# Patient Record
Sex: Female | Born: 1935 | Race: White | Hispanic: No | Smoking: Never smoker
Health system: Southern US, Community
[De-identification: ages and names within clinical notes are randomized; demographics above are authoritative.]

## PROBLEM LIST (undated history)

## (undated) DIAGNOSIS — E119 Type 2 diabetes mellitus without complications: Secondary | ICD-10-CM

## (undated) DIAGNOSIS — D499 Neoplasm of unspecified behavior of unspecified site: Secondary | ICD-10-CM

## (undated) DIAGNOSIS — M810 Age-related osteoporosis without current pathological fracture: Secondary | ICD-10-CM

## (undated) DIAGNOSIS — Z780 Asymptomatic menopausal state: Secondary | ICD-10-CM

## (undated) DIAGNOSIS — M1990 Unspecified osteoarthritis, unspecified site: Secondary | ICD-10-CM

## (undated) DIAGNOSIS — E559 Vitamin D deficiency, unspecified: Secondary | ICD-10-CM

## (undated) DIAGNOSIS — I10 Essential (primary) hypertension: Secondary | ICD-10-CM

## (undated) HISTORY — DX: Vitamin D deficiency, unspecified: E55.9

## (undated) HISTORY — DX: Type 2 diabetes mellitus without complications: E11.9

## (undated) HISTORY — PX: CHOLECYSTECTOMY: SHX55

## (undated) HISTORY — DX: Unspecified osteoarthritis, unspecified site: M19.90

## (undated) HISTORY — DX: Asymptomatic menopausal state: Z78.0

## (undated) HISTORY — PX: CATARACT EXTRACTION: SUR2

## (undated) HISTORY — DX: Essential (primary) hypertension: I10

## (undated) HISTORY — DX: Neoplasm of unspecified behavior of unspecified site: D49.9

## (undated) HISTORY — DX: Age-related osteoporosis without current pathological fracture: M81.0

---

## 2018-08-11 ENCOUNTER — Encounter: Payer: Self-pay | Admitting: Cardiology

## 2018-08-11 DIAGNOSIS — R1310 Dysphagia, unspecified: Secondary | ICD-10-CM | POA: Insufficient documentation

## 2018-08-11 DIAGNOSIS — R011 Cardiac murmur, unspecified: Secondary | ICD-10-CM | POA: Insufficient documentation

## 2018-08-13 DIAGNOSIS — K225 Diverticulum of esophagus, acquired: Secondary | ICD-10-CM | POA: Insufficient documentation

## 2018-08-14 DIAGNOSIS — Z09 Encounter for follow-up examination after completed treatment for conditions other than malignant neoplasm: Secondary | ICD-10-CM | POA: Insufficient documentation

## 2018-08-19 ENCOUNTER — Encounter: Payer: Self-pay | Admitting: Cardiology

## 2018-08-19 ENCOUNTER — Ambulatory Visit (INDEPENDENT_AMBULATORY_CARE_PROVIDER_SITE_OTHER): Payer: Medicare Other | Admitting: Cardiology

## 2018-08-19 DIAGNOSIS — R1310 Dysphagia, unspecified: Secondary | ICD-10-CM

## 2018-08-19 DIAGNOSIS — R011 Cardiac murmur, unspecified: Secondary | ICD-10-CM

## 2018-08-19 NOTE — Progress Notes (Signed)
Cardiology Office Note:    Date:  08/19/2018   ID:  Cynthia Hart, DOB October 07, 1927, MRN [id number]  PCP:  Cynthia Johns, MD  Cardiologist:  Jenean Lindau, MD   Referring MD: Cynthia Johns, MD    ASSESSMENT:    1. Heart murmur   2. Dysphagia, unspecified type    PLAN:    In order of problems listed above:  1. I discussed my findings with the patient at extensive length.  Her cardiac murmur is significant.  In view of her general frail status and her asymptomatic status I will not do any extensive testing as I do not see any indication for this.  Echocardiogram will be done to assess murmur on auscultation. 2. Patient's daughter mentions to me that she has some issues of gastritis from aspirin wonders whether she needs or if there is any strong indication for aspirin.  I do not see any this time.  Matter-of-fact with her gastrointestinal issues it might be a problem for her if she has gastritis and such problems.  So from my standpoint she could go off the aspirin but she will check this with her primary care doctor first. 3. Patient will be seen in follow-up appointment in 6 months or earlier if the patient has any concerns    Medication Adjustments/Labs and Tests Ordered: Current medicines are reviewed at length with the patient today.  Concerns regarding medicines are outlined above.  No orders of the defined types were placed in this encounter.  No orders of the defined types were placed in this encounter.    History of Present Illness:    Cynthia Hart is a 84 y.o. female who is being seen today for the evaluation of cardiac murmur at the request of Cynthia Johns, MD.  Patient is a pleasant 84 year old female.  She has no significant past medical history.  She is accompanied by her daughter.  Patient has had diverticulum of the esophagus and therefore has had a PEG tube.  She is being nourished with the PEG tube.  She was referred because she has had a significant murmur.   She lives in a assisted living facility and is well ambulatory.  She is a sharp lady for her age and mentally very much alert.  She is very pleasant.  She tells me that with activities of daily living which is ambulating with a walker she had no symptoms whatsoever.  At the time of my evaluation, the patient is alert awake oriented and in no distress.  Her daughter agrees.  Past Medical History:  Diagnosis Date  . Diabetes ([place])   . Hypertension   . Osteoarthritis   . Osteoporosis   . Postmenopausal   . Tumor   . Vitamin D deficiency     Past Surgical History:  Procedure Laterality Date  . CATARACT EXTRACTION    . CHOLECYSTECTOMY      Current Medications: Current Meds  Medication Sig  . acetaminophen (TYLENOL) 325 MG tablet Take 650 mg by mouth every 6 (six) hours as needed.  . Calcium 500-125 MG-UNIT TABS Take 1 tablet by mouth daily.  . Cholecalciferol (VITAMIN D3) 50000 units CAPS Take 1 capsule by mouth every 30 (thirty) days.   . lansoprazole (PREVACID SOLUTAB) 30 MG disintegrating tablet Take 1 tablet by mouth daily.  . magnesium oxide (MAG-OX) 400 MG tablet Take 400 mg by mouth daily.  Marland Kitchen rOPINIRole (REQUIP) 0.25 MG tablet Take 1 tablet by mouth at bedtime.  . [DISCONTINUED] denosumab (  PROLIA) 60 MG/ML SOSY injection Inject 60 mg into the skin every 6 (six) months.  . [DISCONTINUED] Polyethylene Glycol 3350-GRX POWD Take 1 packet by mouth as needed.     Allergies:   Alendronate; Codeine; Ibandronic acid; and Penicillins   Social History   Socioeconomic History  . Marital status: Unknown    Spouse name: Not on file  . Number of children: Not on file  . Years of education: Not on file  . Highest education level: Not on file  Occupational History  . Not on file  Social Needs  . Financial resource strain: Not on file  . Food insecurity:    Worry: Not on file    Inability: Not on file  . Transportation needs:    Medical: Not on file    Non-medical: Not on file    Tobacco Use  . Smoking status: Never Smoker  . Smokeless tobacco: Never Used  Substance and Sexual Activity  . Alcohol use: Not on file  . Drug use: Not on file  . Sexual activity: Not on file  Lifestyle  . Physical activity:    Days per week: Not on file    Minutes per session: Not on file  . Stress: Not on file  Relationships  . Social connections:    Talks on phone: Not on file    Gets together: Not on file    Attends religious service: Not on file    Active member of club or organization: Not on file    Attends meetings of clubs or organizations: Not on file    Relationship status: Not on file  Other Topics Concern  . Not on file  Social History Narrative  . Not on file     Family History: The patient's family history includes Breast cancer in her mother; Dementia in her brother; Heart disease in her maternal grandmother; Hypertension in her paternal grandfather.  ROS:   Please see the history of present illness.    All other systems reviewed and are negative.  EKGs/Labs/Other Studies Reviewed:    The following studies were reviewed today: EKG reveals sinus rhythm and nonspecific ST-T changes.   Recent Labs: No results found for requested labs within last 8760 hours.  Recent Lipid Panel No results found for: CHOL, TRIG, HDL, CHOLHDL, VLDL, LDLCALC, LDLDIRECT  Physical Exam:    VS:  BP (!) 112/58 (BP Location: Right Arm, Patient Position: Sitting, Cuff Size: Normal)   Pulse 67   Ht 5\' 7"  (1.702 m)   Wt 86 lb 3.2 oz (39.1 kg)   SpO2 (!) 87%   BMI 13.50 kg/m     Wt Readings from Last 3 Encounters:  08/19/18 86 lb 3.2 oz (39.1 kg)     GEN: Patient is in no acute distress HEENT: Normal NECK: No JVD; No carotid bruits LYMPHATICS: No lymphadenopathy CARDIAC: S1 S2 regular, 2/6 systolic murmur at the apex. RESPIRATORY:  Clear to auscultation without rales, wheezing or rhonchi  ABDOMEN: Soft, non-tender, non-distended MUSCULOSKELETAL:  No edema; No  deformity  SKIN: Warm and dry NEUROLOGIC:  Alert and oriented x 3 PSYCHIATRIC:  Normal affect    Signed, Jenean Lindau, MD  08/19/2018 9:48 AM    [place]

## 2018-08-19 NOTE — Patient Instructions (Addendum)
Medication Instructions:  Your physician recommends that you continue on your current medications as directed. Please refer to the Current Medication list given to you today.  Labwork: None  Testing/Procedures: Your physician has requested that you have an echocardiogram. Echocardiography is a painless test that uses sound waves to create images of your heart. It provides your doctor with information about the size and shape of your heart and how well your heart's chambers and valves are working. This procedure takes approximately one hour. There are no restrictions for this procedure.  Follow-Up: Your physician recommends that you schedule a follow-up appointment in: 6 months  Any Other Special Instructions Will Be Listed Below (If Applicable).     If you need a refill on your cardiac medications before your next appointment, please call your pharmacy.   [place], RN, BSN  Echocardiogram An echocardiogram, or echocardiography, uses sound waves (ultrasound) to produce an image of your heart. The echocardiogram is simple, painless, obtained within a short period of time, and offers valuable information to your health care provider. The images from an echocardiogram can provide information such as:  Evidence of coronary artery disease (CAD).  Heart size.  Heart muscle function.  Heart valve function.  Aneurysm detection.  Evidence of a past heart attack.  Fluid buildup around the heart.  Heart muscle thickening.  Assess heart valve function.  Tell a health care provider about:  Any allergies you have.  All medicines you are taking, including vitamins, herbs, eye drops, creams, and over-the-counter medicines.  Any problems you or family members have had with anesthetic medicines.  Any blood disorders you have.  Any surgeries you have had.  Any medical conditions you have.  Whether you are pregnant or may be pregnant. What happens before the  procedure? No special preparation is needed. Eat and drink normally. What happens during the procedure?  In order to produce an image of your heart, gel will be applied to your chest and a wand-like tool (transducer) will be moved over your chest. The gel will help transmit the sound waves from the transducer. The sound waves will harmlessly bounce off your heart to allow the heart images to be captured in real-time motion. These images will then be recorded.  You may need an IV to receive a medicine that improves the quality of the pictures. What happens after the procedure? You may return to your normal schedule including diet, activities, and medicines, unless your health care provider tells you otherwise. This information is not intended to replace advice given to you by your health care provider. Make sure you discuss any questions you have with your health care provider. Document Released: 11/09/2000 Document Revised: 06/30/2016 Document Reviewed: 07/20/2013 Elsevier Interactive Patient Education  2017 [employer].

## 2018-08-26 ENCOUNTER — Ambulatory Visit (INDEPENDENT_AMBULATORY_CARE_PROVIDER_SITE_OTHER): Payer: Medicare Other

## 2018-08-26 DIAGNOSIS — R011 Cardiac murmur, unspecified: Secondary | ICD-10-CM | POA: Diagnosis not present

## 2018-08-26 NOTE — Progress Notes (Signed)
Complete echocardiogram has been performed.  Jimmy Kelsey Edman, RDCS, RVT 

## 2018-08-27 ENCOUNTER — Telehealth: Payer: Self-pay

## 2018-08-27 NOTE — Telephone Encounter (Signed)
-----   Message from Jenean Lindau, MD sent at 08/27/2018  8:37 AM EDT ----- Please schedule an elective appointment to discuss these results Jenean Lindau, MD 08/27/2018 8:37 AM

## 2018-08-27 NOTE — Telephone Encounter (Signed)
Unable to leave voicemail regarding results will attempt to call again later.

## 2018-08-28 ENCOUNTER — Telehealth: Payer: Self-pay

## 2018-08-28 NOTE — Telephone Encounter (Signed)
Daughter will call the office with date and time for appointment to discuss abnormal echo.

## 2018-09-05 ENCOUNTER — Ambulatory Visit (INDEPENDENT_AMBULATORY_CARE_PROVIDER_SITE_OTHER): Payer: Medicare Other | Admitting: Cardiology

## 2018-09-05 ENCOUNTER — Encounter: Payer: Self-pay | Admitting: Cardiology

## 2018-09-05 DIAGNOSIS — I340 Nonrheumatic mitral (valve) insufficiency: Secondary | ICD-10-CM | POA: Insufficient documentation

## 2018-09-05 DIAGNOSIS — Z01810 Encounter for preprocedural cardiovascular examination: Secondary | ICD-10-CM | POA: Diagnosis not present

## 2018-09-05 DIAGNOSIS — I272 Pulmonary hypertension, unspecified: Secondary | ICD-10-CM

## 2018-09-05 NOTE — Progress Notes (Signed)
Cardiology Office Note:    Date:  09/05/2018   ID:  Cynthia Hart, DOB August 01, 1927, MRN [id number]  PCP:  Nicholos Johns, MD  Cardiologist:  Jenean Lindau, MD   Referring MD: Nicholos Johns, MD    ASSESSMENT:    1. Pre-operative cardiovascular examination   2. Myxomatous mitral valve regurgitation   3. Moderate to severe mitral regurgitation   4. Pulmonary hypertension, unspecified ([place])    PLAN:    In order of problems listed above:  1. I discussed my findings with the patient at extensive length.  An echocardiogram is significantly abnormal.  She is a frail lady with overall stable condition at low level.  She plans to undergo Zenker's diverticulum surgery and I mentioned to her that she is moderate to high risk for that surgery based on the findings of the echocardiogram.  Also in view of her frail nature and the fact that she is overall asymptomatic with activities of daily living, of which she does little I did not want to subject her to a test like a stress test.  I mentioned to her and her daughter that she is moderate to high risk and she will have to discuss this with her surgeon.  Unfortunately I cannot put her on any medications like a beta-blocker because of blood pressure and heart rate a very borderline.  It would be her decision to accept this risks and to go forward with the surgery if she decides to do so because she tells me that she has no quality of life now because she cannot taste her food and she is being fed with the PEG tube and has multiple other complex issues of constipation amongst. 2. Patient will be seen in follow-up appointment in 12 months or earlier if the patient has any concerns   Medication Adjustments/Labs and Tests Ordered: Current medicines are reviewed at length with the patient today.  Concerns regarding medicines are outlined above.  No orders of the defined types were placed in this encounter.  No orders of the defined types were placed in  this encounter.    No chief complaint on file.    History of Present Illness:    Cynthia Hart is a 84 y.o. female. The patient was referred for a cardiac murmur.  The patient underwent echocardiographic evaluation and this revealed normal left ventricular systolic function but moderate to severe mitral regurgitation with myxomatous mitral valve degeneration and prolapse.  The patient also has a left atrium that is severely dilated, moderate tricuspid regurgitation and significantly elevated pulmonary artery systolic pressure of approximately 62 mmHg at least.  She is here for follow-up.  She is a frail lady.  Past Medical History:  Diagnosis Date  . Diabetes ([place])   . Hypertension   . Osteoarthritis   . Osteoporosis   . Postmenopausal   . Tumor   . Vitamin D deficiency     Past Surgical History:  Procedure Laterality Date  . CATARACT EXTRACTION    . CHOLECYSTECTOMY      Current Medications: Current Meds  Medication Sig  . acetaminophen (TYLENOL) 325 MG tablet Take 650 mg by mouth every 6 (six) hours as needed.  . Calcium 500-125 MG-UNIT TABS Take 1 tablet by mouth daily.  . Cholecalciferol (VITAMIN D3) 50000 units CAPS Take 1 capsule by mouth every 30 (thirty) days.   . lansoprazole (PREVACID SOLUTAB) 30 MG disintegrating tablet Take 1 tablet by mouth daily.  . magnesium oxide (MAG-OX) 400 MG tablet  Take 400 mg by mouth daily.  Marland Kitchen rOPINIRole (REQUIP) 0.25 MG tablet Take 1 tablet by mouth at bedtime.     Allergies:   Alendronate; Codeine; Ibandronic acid; and Penicillins   Social History   Socioeconomic History  . Marital status: Unknown    Spouse name: Not on file  . Number of children: Not on file  . Years of education: Not on file  . Highest education level: Not on file  Occupational History  . Not on file  Social Needs  . Financial resource strain: Not on file  . Food insecurity:    Worry: Not on file    Inability: Not on file  . Transportation needs:     Medical: Not on file    Non-medical: Not on file  Tobacco Use  . Smoking status: Never Smoker  . Smokeless tobacco: Never Used  Substance and Sexual Activity  . Alcohol use: Not on file  . Drug use: Not on file  . Sexual activity: Not on file  Lifestyle  . Physical activity:    Days per week: Not on file    Minutes per session: Not on file  . Stress: Not on file  Relationships  . Social connections:    Talks on phone: Not on file    Gets together: Not on file    Attends religious service: Not on file    Active member of club or organization: Not on file    Attends meetings of clubs or organizations: Not on file    Relationship status: Not on file  Other Topics Concern  . Not on file  Social History Narrative  . Not on file     Family History: The patient's family history includes Breast cancer in her mother; Dementia in her brother; Heart disease in her maternal grandmother; Hypertension in her paternal grandfather.  ROS:   Please see the history of present illness.    All other systems reviewed and are negative.  EKGs/Labs/Other Studies Reviewed:    The following studies were reviewed today: - Left ventricle: The cavity size was normal. Wall thickness was   increased in a pattern of mild LVH. Systolic function was normal.   The estimated ejection fraction was in the range of 60% to 65%.   Wall motion was normal; there were no regional wall motion   abnormalities. - Aortic valve: Mildly calcified annulus. Trileaflet; mildly   thickened leaflets. There was mild regurgitation. - Mitral valve: Severely thickened leaflets anterior and posterior.   Severe myxomatous degeneration. Prolapse. , involving the   anterior leaflet and the posterior leaflet. There was moderate to   severe regurgitation, with a single jet directed eccentrically   and toward the septum. - Left atrium: The atrium was severely dilated. - Tricuspid valve: There was moderate regurgitation. -  Pulmonic valve: There was moderate regurgitation. - Pulmonary arteries: PA peak pressure: 62 mm Hg (S).   Recent Labs: No results found for requested labs within last 8760 hours.  Recent Lipid Panel No results found for: CHOL, TRIG, HDL, CHOLHDL, VLDL, LDLCALC, LDLDIRECT  Physical Exam:    VS:  BP 112/60 (BP Location: Right Arm, Patient Position: Sitting, Cuff Size: Normal)   Pulse 83   Ht 5\' 7"  (1.702 m)   Wt 89 lb 12.8 oz (40.7 kg)   SpO2 94%   BMI 14.06 kg/m     Wt Readings from Last 3 Encounters:  09/05/18 89 lb 12.8 oz (40.7 kg)  08/19/18 86 lb  3.2 oz (39.1 kg)     GEN: Patient is in no acute distress HEENT: Normal NECK: No JVD; No carotid bruits LYMPHATICS: No lymphadenopathy CARDIAC: Hear sounds regular, 2/6 systolic murmur at the apex. RESPIRATORY:  Clear to auscultation without rales, wheezing or rhonchi  ABDOMEN: Soft, non-tender, non-distended MUSCULOSKELETAL:  No edema; No deformity  SKIN: Warm and dry NEUROLOGIC:  Alert and oriented x 3 PSYCHIATRIC:  Normal affect   Signed, Jenean Lindau, MD  09/05/2018 11:39 AM    [facility]

## 2018-09-05 NOTE — Patient Instructions (Signed)
Medication Instructions:  Your physician recommends that you continue on your current medications as directed. Please refer to the Current Medication list given to you today.  If you need a refill on your cardiac medications before your next appointment, please call your pharmacy.   Lab work: None  If you have labs (blood work) drawn today and your tests are completely normal, you will receive your results only by: . MyChart Message (if you have MyChart) OR . A paper copy in the mail If you have any lab test that is abnormal or we need to change your treatment, we will call you to review the results.  Testing/Procedures: None  Follow-Up: At [facility], you and your health needs are our priority.  As part of our continuing mission to provide you with exceptional heart care, we have created designated Provider Care Teams.  These Care Teams include your primary Cardiologist (physician) and Advanced Practice Providers (APPs -  Physician Assistants and Nurse Practitioners) who all work together to provide you with the care you need, when you need it. You will need a follow up appointment in 1 years.  Please call our office 2 months in advance to schedule this appointment.  You may see No primary care provider on file. or another member of our [facility] Provider Team in [place]: Robert Krasowski, MD . Brian Munley, MD  Any Other Special Instructions Will Be Listed Below (If Applicable).   

## 2019-05-22 ENCOUNTER — Emergency Department: Payer: Medicare Other

## 2019-05-22 ENCOUNTER — Inpatient Hospital Stay
Admission: EM | Admit: 2019-05-22 | Discharge: 2019-05-25 | DRG: 481 | Disposition: A | Discharge status: Skilled Nursing Facility | Payer: Medicare Other | Source: Skilled Nursing Facility | Attending: Internal Medicine | Admitting: Internal Medicine

## 2019-05-22 ENCOUNTER — Inpatient Hospital Stay: Payer: Medicare Other | Admitting: Anesthesiology

## 2019-05-22 ENCOUNTER — Other Ambulatory Visit: Payer: Self-pay

## 2019-05-22 ENCOUNTER — Encounter
Admission: EM | Disposition: A | Discharge status: Skilled Nursing Facility | Payer: Self-pay | Source: Skilled Nursing Facility | Attending: Internal Medicine

## 2019-05-22 ENCOUNTER — Inpatient Hospital Stay: Payer: Medicare Other

## 2019-05-22 DIAGNOSIS — D62 Acute posthemorrhagic anemia: Secondary | ICD-10-CM | POA: Diagnosis not present

## 2019-05-22 DIAGNOSIS — Z880 Allergy status to penicillin: Secondary | ICD-10-CM

## 2019-05-22 DIAGNOSIS — E559 Vitamin D deficiency, unspecified: Secondary | ICD-10-CM | POA: Diagnosis present

## 2019-05-22 DIAGNOSIS — E119 Type 2 diabetes mellitus without complications: Secondary | ICD-10-CM | POA: Diagnosis present

## 2019-05-22 DIAGNOSIS — S72141A Displaced intertrochanteric fracture of right femur, initial encounter for closed fracture: Principal | ICD-10-CM | POA: Diagnosis present

## 2019-05-22 DIAGNOSIS — Z8249 Family history of ischemic heart disease and other diseases of the circulatory system: Secondary | ICD-10-CM

## 2019-05-22 DIAGNOSIS — M810 Age-related osteoporosis without current pathological fracture: Secondary | ICD-10-CM | POA: Diagnosis present

## 2019-05-22 DIAGNOSIS — Y92129 Unspecified place in nursing home as the place of occurrence of the external cause: Secondary | ICD-10-CM | POA: Diagnosis not present

## 2019-05-22 DIAGNOSIS — Z885 Allergy status to narcotic agent status: Secondary | ICD-10-CM

## 2019-05-22 DIAGNOSIS — S72009A Fracture of unspecified part of neck of unspecified femur, initial encounter for closed fracture: Secondary | ICD-10-CM | POA: Diagnosis present

## 2019-05-22 DIAGNOSIS — R509 Fever, unspecified: Secondary | ICD-10-CM | POA: Diagnosis not present

## 2019-05-22 DIAGNOSIS — Z888 Allergy status to other drugs, medicaments and biological substances status: Secondary | ICD-10-CM | POA: Diagnosis not present

## 2019-05-22 DIAGNOSIS — F0390 Unspecified dementia without behavioral disturbance: Secondary | ICD-10-CM | POA: Diagnosis present

## 2019-05-22 DIAGNOSIS — Y9301 Activity, walking, marching and hiking: Secondary | ICD-10-CM | POA: Diagnosis present

## 2019-05-22 DIAGNOSIS — G2581 Restless legs syndrome: Secondary | ICD-10-CM | POA: Diagnosis present

## 2019-05-22 DIAGNOSIS — M79671 Pain in right foot: Secondary | ICD-10-CM | POA: Diagnosis present

## 2019-05-22 DIAGNOSIS — S51811A Laceration without foreign body of right forearm, initial encounter: Secondary | ICD-10-CM | POA: Diagnosis present

## 2019-05-22 DIAGNOSIS — I10 Essential (primary) hypertension: Secondary | ICD-10-CM | POA: Diagnosis present

## 2019-05-22 DIAGNOSIS — Z803 Family history of malignant neoplasm of breast: Secondary | ICD-10-CM

## 2019-05-22 DIAGNOSIS — W1809XA Striking against other object with subsequent fall, initial encounter: Secondary | ICD-10-CM | POA: Diagnosis present

## 2019-05-22 DIAGNOSIS — R946 Abnormal results of thyroid function studies: Secondary | ICD-10-CM | POA: Diagnosis present

## 2019-05-22 DIAGNOSIS — M1990 Unspecified osteoarthritis, unspecified site: Secondary | ICD-10-CM | POA: Diagnosis present

## 2019-05-22 DIAGNOSIS — Z1159 Encounter for screening for other viral diseases: Secondary | ICD-10-CM

## 2019-05-22 DIAGNOSIS — R0902 Hypoxemia: Secondary | ICD-10-CM | POA: Diagnosis present

## 2019-05-22 DIAGNOSIS — Z419 Encounter for procedure for purposes other than remedying health state, unspecified: Secondary | ICD-10-CM

## 2019-05-22 DIAGNOSIS — W19XXXA Unspecified fall, initial encounter: Secondary | ICD-10-CM

## 2019-05-22 HISTORY — PX: INTRAMEDULLARY (IM) NAIL INTERTROCHANTERIC: SHX5875

## 2019-05-22 LAB — SURGICAL PCR SCREEN
MRSA, PCR: NEGATIVE
Staphylococcus aureus: NEGATIVE

## 2019-05-22 LAB — COMPREHENSIVE METABOLIC PANEL
ALT: 13 U/L (ref 0–44)
AST: 19 U/L (ref 15–41)
Albumin: 3.4 g/dL — ABNORMAL LOW (ref 3.5–5.0)
Alkaline Phosphatase: 72 U/L (ref 38–126)
Anion gap: 9 (ref 5–15)
BUN: 25 mg/dL — ABNORMAL HIGH (ref 8–23)
CO2: 26 mmol/L (ref 22–32)
Calcium: 8.9 mg/dL (ref 8.9–10.3)
Chloride: 105 mmol/L (ref 98–111)
Creatinine, Ser: 0.94 mg/dL (ref 0.44–1.00)
GFR calc Af Amer: >60 mL/min (ref >60)
GFR calc non Af Amer: 53 mL/min — ABNORMAL LOW (ref >60)
Glucose, Bld: 151 mg/dL — ABNORMAL HIGH (ref 70–99)
Potassium: 3.9 mmol/L (ref 3.5–5.1)
Sodium: 140 mmol/L (ref 135–145)
Total Bilirubin: 0.3 mg/dL (ref 0.3–1.2)
Total Protein: 6.8 g/dL (ref 6.5–8.1)

## 2019-05-22 LAB — CBC WITH DIFFERENTIAL/PLATELET
Abs Immature Granulocytes: 0.04 10*3/uL (ref 0.00–0.07)
Basophils Absolute: 0 10*3/uL (ref 0.0–0.1)
Basophils Relative: 0 %
Eosinophils Absolute: 0.1 10*3/uL (ref 0.0–0.5)
Eosinophils Relative: 1 %
HCT: 37.3 % (ref 36.0–46.0)
Hemoglobin: 12 g/dL (ref 12.0–15.0)
Immature Granulocytes: 1 %
Lymphocytes Relative: 27 %
Lymphs Abs: 2.3 10*3/uL (ref 0.7–4.0)
MCH: 31.4 pg (ref 26.0–34.0)
MCHC: 32.2 g/dL (ref 30.0–36.0)
MCV: 97.6 fL (ref 80.0–100.0)
Monocytes Absolute: 1.2 10*3/uL — ABNORMAL HIGH (ref 0.1–1.0)
Monocytes Relative: 13 %
Neutro Abs: 5.1 10*3/uL (ref 1.7–7.7)
Neutrophils Relative %: 58 %
Platelets: 243 10*3/uL (ref 150–400)
RBC: 3.82 MIL/uL — ABNORMAL LOW (ref 3.87–5.11)
RDW: 13.3 % (ref 11.5–15.5)
WBC: 8.7 10*3/uL (ref 4.0–10.5)
nRBC: 0 % (ref 0.0–0.2)

## 2019-05-22 LAB — TYPE AND SCREEN
ABO/RH(D): O POS
Antibody Screen: NEGATIVE

## 2019-05-22 LAB — APTT: aPTT: 32 seconds (ref 24–36)

## 2019-05-22 LAB — TSH: TSH: 5.177 u[IU]/mL — ABNORMAL HIGH (ref 0.350–4.500)

## 2019-05-22 LAB — SARS CORONAVIRUS 2 BY RT PCR (HOSPITAL ORDER, PERFORMED IN ~~LOC~~ HOSPITAL LAB): SARS Coronavirus 2: NEGATIVE

## 2019-05-22 LAB — PROTIME-INR
INR: 1 (ref 0.8–1.2)
Prothrombin Time: 13.2 seconds (ref 11.4–15.2)

## 2019-05-22 SURGERY — FIXATION, FRACTURE, INTERTROCHANTERIC, WITH INTRAMEDULLARY ROD
Anesthesia: Spinal | Site: Hip | Laterality: Right

## 2019-05-22 MED ORDER — MORPHINE SULFATE (PF) 4 MG/ML IV SOLN
4.0000 mg | INTRAVENOUS | Status: DC | PRN
  Administered 2019-05-22: 4 mg via INTRAVENOUS
  Filled 2019-05-22: qty 1

## 2019-05-22 MED ORDER — MAGNESIUM HYDROXIDE 400 MG/5ML PO SUSP: 30.0000 mL | Freq: Every day | ORAL | Status: DC | PRN

## 2019-05-22 MED ORDER — ACETAMINOPHEN 325 MG PO TABS
650.0000 mg | ORAL_TABLET | Freq: Four times a day (QID) | ORAL | Status: DC | PRN
  Administered 2019-05-23: 650 mg via ORAL
  Filled 2019-05-22: qty 2

## 2019-05-22 MED ORDER — DOCUSATE SODIUM 100 MG PO CAPS: 100.0000 mg | ORAL_CAPSULE | Freq: Two times a day (BID) | ORAL | Status: DC

## 2019-05-22 MED ORDER — ROPINIROLE HCL 0.25 MG PO TABS
0.2500 mg | ORAL_TABLET | Freq: Every day | ORAL | Status: DC
  Administered 2019-05-22 – 2019-05-24 (×3): 0.25 mg via ORAL
  Filled 2019-05-22 (×4): qty 1

## 2019-05-22 MED ORDER — ACETAMINOPHEN 650 MG RE SUPP: 650.0000 mg | Freq: Four times a day (QID) | RECTAL | Status: DC | PRN

## 2019-05-22 MED ORDER — OXYCODONE-ACETAMINOPHEN 5-325 MG PO TABS
1.0000 | ORAL_TABLET | ORAL | Status: DC | PRN
  Administered 2019-05-23 – 2019-05-25 (×4): 1 via ORAL
  Filled 2019-05-22 (×4): qty 1

## 2019-05-22 MED ORDER — PHENYLEPHRINE HCL (PRESSORS) 10 MG/ML IV SOLN
INTRAVENOUS | Status: DC | PRN
  Administered 2019-05-22 (×5): 100 ug via INTRAVENOUS

## 2019-05-22 MED ORDER — ALUM & MAG HYDROXIDE-SIMETH 200-200-20 MG/5ML PO SUSP: 30.0000 mL | ORAL | Status: DC | PRN

## 2019-05-22 MED ORDER — BISACODYL 10 MG RE SUPP
10.0000 mg | Freq: Every day | RECTAL | Status: DC | PRN
  Administered 2019-05-24: 10 mg via RECTAL
  Filled 2019-05-22: qty 1

## 2019-05-22 MED ORDER — METOCLOPRAMIDE HCL 5 MG/ML IJ SOLN: 5.0000 mg | Freq: Three times a day (TID) | INTRAMUSCULAR | Status: DC | PRN

## 2019-05-22 MED ORDER — DONEPEZIL HCL 5 MG PO TABS
10.0000 mg | ORAL_TABLET | Freq: Every day | ORAL | Status: DC
  Administered 2019-05-22 – 2019-05-24 (×3): 10 mg via ORAL
  Filled 2019-05-22 (×3): qty 2

## 2019-05-22 MED ORDER — KETAMINE HCL 50 MG/ML IJ SOLN
INTRAMUSCULAR | Status: AC
  Filled 2019-05-22: qty 10

## 2019-05-22 MED ORDER — MENTHOL 3 MG MT LOZG
1.0000 | LOZENGE | OROMUCOSAL | Status: DC | PRN
  Filled 2019-05-22: qty 9

## 2019-05-22 MED ORDER — EPHEDRINE SULFATE 50 MG/ML IJ SOLN
INTRAMUSCULAR | Status: DC | PRN
  Administered 2019-05-22 (×2): 10 mg via INTRAVENOUS
  Administered 2019-05-22: 5 mg via INTRAVENOUS

## 2019-05-22 MED ORDER — ENOXAPARIN SODIUM 40 MG/0.4ML ~~LOC~~ SOLN
40.0000 mg | SUBCUTANEOUS | Status: DC
  Administered 2019-05-23 – 2019-05-25 (×3): 40 mg via SUBCUTANEOUS
  Filled 2019-05-22 (×3): qty 0.4

## 2019-05-22 MED ORDER — PROPOFOL 500 MG/50ML IV EMUL
INTRAVENOUS | Status: AC
  Filled 2019-05-22: qty 50

## 2019-05-22 MED ORDER — METOCLOPRAMIDE HCL 10 MG PO TABS: 5.0000 mg | ORAL_TABLET | Freq: Three times a day (TID) | ORAL | Status: DC | PRN

## 2019-05-22 MED ORDER — BUPIVACAINE HCL (PF) 0.5 % IJ SOLN
INTRAMUSCULAR | Status: AC
  Filled 2019-05-22: qty 10

## 2019-05-22 MED ORDER — KETOROLAC TROMETHAMINE 15 MG/ML IJ SOLN
15.0000 mg | Freq: Four times a day (QID) | INTRAMUSCULAR | Status: DC | PRN
  Administered 2019-05-22 – 2019-05-25 (×2): 15 mg via INTRAVENOUS
  Filled 2019-05-22 (×2): qty 1

## 2019-05-22 MED ORDER — BACITRACIN-NEOMYCIN-POLYMYXIN 400-5-5000 EX OINT
TOPICAL_OINTMENT | CUTANEOUS | Status: AC
  Administered 2019-05-22: 2 via TOPICAL
  Filled 2019-05-22: qty 2

## 2019-05-22 MED ORDER — ONDANSETRON HCL 4 MG/2ML IJ SOLN: 4.0000 mg | Freq: Four times a day (QID) | INTRAMUSCULAR | Status: DC | PRN

## 2019-05-22 MED ORDER — MAGNESIUM CITRATE PO SOLN
1.0000 | Freq: Once | ORAL | Status: DC | PRN
  Filled 2019-05-22: qty 296

## 2019-05-22 MED ORDER — ONDANSETRON HCL 4 MG/2ML IJ SOLN
4.0000 mg | INTRAMUSCULAR | Status: AC
  Administered 2019-05-22: 4 mg via INTRAVENOUS
  Filled 2019-05-22: qty 2

## 2019-05-22 MED ORDER — ONDANSETRON HCL 4 MG/2ML IJ SOLN: 4.0000 mg | Freq: Once | INTRAMUSCULAR | Status: DC | PRN

## 2019-05-22 MED ORDER — FENTANYL CITRATE (PF) 100 MCG/2ML IJ SOLN
INTRAMUSCULAR | Status: AC
  Filled 2019-05-22: qty 2

## 2019-05-22 MED ORDER — PROPOFOL 10 MG/ML IV BOLUS
INTRAVENOUS | Status: DC | PRN
  Administered 2019-05-22: 20 mg via INTRAVENOUS

## 2019-05-22 MED ORDER — BACITRACIN-NEOMYCIN-POLYMYXIN 400-5-5000 EX OINT
TOPICAL_OINTMENT | Freq: Once | CUTANEOUS | Status: AC
  Administered 2019-05-22: 2 via TOPICAL

## 2019-05-22 MED ORDER — PHENOL 1.4 % MT LIQD
1.0000 | OROMUCOSAL | Status: DC | PRN
  Filled 2019-05-22: qty 177

## 2019-05-22 MED ORDER — CLINDAMYCIN PHOSPHATE 600 MG/50ML IV SOLN
600.0000 mg | Freq: Four times a day (QID) | INTRAVENOUS | Status: AC
  Administered 2019-05-22 – 2019-05-23 (×2): 600 mg via INTRAVENOUS
  Filled 2019-05-22 (×2): qty 50

## 2019-05-22 MED ORDER — LIDOCAINE HCL (CARDIAC) PF 100 MG/5ML IV SOSY
PREFILLED_SYRINGE | INTRAVENOUS | Status: DC | PRN
  Administered 2019-05-22: 40 mg via INTRAVENOUS

## 2019-05-22 MED ORDER — BUPIVACAINE HCL (PF) 0.5 % IJ SOLN
INTRAMUSCULAR | Status: DC | PRN
  Administered 2019-05-22: 2.5 mL

## 2019-05-22 MED ORDER — DOCUSATE SODIUM 100 MG PO CAPS
100.0000 mg | ORAL_CAPSULE | Freq: Two times a day (BID) | ORAL | Status: DC
  Administered 2019-05-22 – 2019-05-25 (×6): 100 mg via ORAL
  Filled 2019-05-22 (×6): qty 1

## 2019-05-22 MED ORDER — ONDANSETRON HCL 4 MG PO TABS: 4.0000 mg | ORAL_TABLET | Freq: Four times a day (QID) | ORAL | Status: DC | PRN

## 2019-05-22 MED ORDER — FENTANYL CITRATE (PF) 100 MCG/2ML IJ SOLN: 25.0000 ug | INTRAMUSCULAR | Status: DC | PRN

## 2019-05-22 MED ORDER — PROPOFOL 500 MG/50ML IV EMUL
INTRAVENOUS | Status: DC | PRN
  Administered 2019-05-22: 50 ug/kg/min via INTRAVENOUS

## 2019-05-22 MED ORDER — SODIUM CHLORIDE 0.9 % IV SOLN
INTRAVENOUS | Status: DC
  Administered 2019-05-22 (×2): via INTRAVENOUS

## 2019-05-22 MED ORDER — MORPHINE SULFATE (PF) 2 MG/ML IV SOLN
1.0000 mg | INTRAVENOUS | Status: DC | PRN
  Administered 2019-05-22 (×3): 1 mg via INTRAVENOUS
  Filled 2019-05-22 (×2): qty 1

## 2019-05-22 MED ORDER — PHENYLEPHRINE HCL (PRESSORS) 10 MG/ML IV SOLN
INTRAVENOUS | Status: AC
  Filled 2019-05-22: qty 1

## 2019-05-22 MED ORDER — NEOMYCIN-POLYMYXIN B GU 40-200000 IR SOLN
Status: DC | PRN
  Administered 2019-05-22: 2 mL

## 2019-05-22 MED ORDER — SODIUM CHLORIDE 0.9 % IV SOLN
INTRAVENOUS | Status: DC | PRN
  Administered 2019-05-22: 50 ug/min via INTRAVENOUS

## 2019-05-22 MED ORDER — SODIUM CHLORIDE 0.9 % IV SOLN
INTRAVENOUS | Status: DC
  Administered 2019-05-22: 18:00:00 via INTRAVENOUS

## 2019-05-22 MED ORDER — FLUTICASONE PROPIONATE 50 MCG/ACT NA SUSP
1.0000 | Freq: Every day | NASAL | Status: DC
  Administered 2019-05-23 – 2019-05-25 (×3): 1 via NASAL
  Filled 2019-05-22: qty 16

## 2019-05-22 MED ORDER — CLINDAMYCIN PHOSPHATE 600 MG/50ML IV SOLN
600.0000 mg | Freq: Once | INTRAVENOUS | Status: AC
  Administered 2019-05-22: 600 mg via INTRAVENOUS
  Filled 2019-05-22: qty 50

## 2019-05-22 SURGICAL SUPPLY — 35 items
BIT DRILL 4.3MMS DISTAL GRDTED (BIT) IMPLANT
CANISTER SUCT 1200ML W/VALVE (MISCELLANEOUS) ×2 IMPLANT
CHLORAPREP W/TINT 26 (MISCELLANEOUS) ×2 IMPLANT
CORTICAL BONE SCR 5.0MM X 46MM (Screw) ×2 IMPLANT
COVER WAND RF STERILE (DRAPES) ×2 IMPLANT
DRAPE SHEET LG 3/4 BI-LAMINATE (DRAPES) ×3 IMPLANT
DRAPE U-SHAPE 47X51 STRL (DRAPES) ×2 IMPLANT
DRILL 4.3MMS DISTAL GRADUATED (BIT) ×2
DRSG OPSITE POSTOP 3X4 (GAUZE/BANDAGES/DRESSINGS) ×6 IMPLANT
GLOVE BIOGEL PI IND STRL 9 (GLOVE) ×1 IMPLANT
GLOVE BIOGEL PI INDICATOR 9 (GLOVE) ×1
GLOVE SURG SYN 9.0  PF PI (GLOVE) ×1
GLOVE SURG SYN 9.0 PF PI (GLOVE) ×1 IMPLANT
GOWN SRG 2XL LVL 4 RGLN SLV (GOWNS) ×1 IMPLANT
GOWN STRL NON-REIN 2XL LVL4 (GOWNS) ×1
GOWN STRL REUS W/ TWL LRG LVL3 (GOWN DISPOSABLE) ×1 IMPLANT
GOWN STRL REUS W/TWL LRG LVL3 (GOWN DISPOSABLE) ×1
GUIDEPIN VERSANAIL DSP 3.2X444 (ORTHOPEDIC DISPOSABLE SUPPLIES) ×1 IMPLANT
GUIDEWIRE BALL NOSE 80CM (WIRE) ×1 IMPLANT
HFN LAG SCREW 10.5MM X 115MM (Orthopedic Implant) ×1 IMPLANT
KIT TURNOVER KIT A (KITS) ×2 IMPLANT
MAT ABSORB  FLUID 56X50 GRAY (MISCELLANEOUS) ×1
MAT ABSORB FLUID 56X50 GRAY (MISCELLANEOUS) ×1 IMPLANT
NAIL HIP FRAC RT 130 11MX400M (Nail) ×1 IMPLANT
NDL FILTER BLUNT 18X1 1/2 (NEEDLE) ×1 IMPLANT
NEEDLE FILTER BLUNT 18X 1/2SAF (NEEDLE) ×1
NEEDLE FILTER BLUNT 18X1 1/2 (NEEDLE) ×1 IMPLANT
NS IRRIG 500ML POUR BTL (IV SOLUTION) ×2 IMPLANT
PACK HIP COMPR (MISCELLANEOUS) ×2 IMPLANT
SCALPEL PROTECTED #15 DISP (BLADE) ×4 IMPLANT
SCREW CORTICL BON 5.0MM X 46MM (Screw) IMPLANT
STAPLER SKIN PROX 35W (STAPLE) ×2 IMPLANT
SUT VIC AB 1 CT1 36 (SUTURE) ×2 IMPLANT
SUT VIC AB 2-0 CT1 (SUTURE) ×2 IMPLANT
SYR 10ML LL (SYRINGE) ×2 IMPLANT

## 2019-05-22 NOTE — NC FL2 (Signed)
[place] LEVEL OF CARE SCREENING TOOL     IDENTIFICATION  Patient Name: Cynthia Hart Birthdate: 1927/08/09 Sex: female Admission Date (Current Location): 05/22/2019  [place] and [place] Number:      Facility and Address:  [facility], [address], [place], [place] [address]      Provider Number: [id number]  Attending Physician Name and Address:  Lang Snow,*  Relative Name and Phone Number:       Current Level of Care: Hospital Recommended Level of Care: [place] Prior Approval Number:    Date Approved/Denied:   PASRR Number: [id number]  Discharge Plan: SNF    Current Diagnoses: Patient Active Problem List   Diagnosis Date Noted  . Hip fracture ([place]) 05/22/2019  . Pre-operative cardiovascular examination 09/05/2018  . 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 Moderate to severe mitral regurgitation 09/05/2018  . Pulmonary hypertension, unspecified ([place]) 09/05/2018  . Postoperative examination 08/14/2018  . 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 Heart murmur 08/11/2018  . Dysphagia 08/11/2018    Orientation RESPIRATION BLADDER Height & Weight     Self, Time, Situation, Place  O2(2 Liters Oxygen.) Continent Weight: 123 lb 7.3 oz (56 kg) Height:  5\' 7"  (170.2 cm)  BEHAVIORAL SYMPTOMS/MOOD NEUROLOGICAL BOWEL NUTRITION STATUS      Continent Diet(Diet: NPO for surgery to be advanced.)  AMBULATORY STATUS COMMUNICATION OF NEEDS Skin   Extensive Assist Verbally Surgical wounds                       Personal Care Assistance Level of Assistance  Bathing, Feeding, Dressing Bathing Assistance: Limited assistance Feeding assistance: Independent Dressing Assistance: Limited assistance     Functional Limitations Info  Sight, Hearing, Speech Sight Info: Impaired Hearing Info: Adequate Speech Info: Adequate    SPECIAL CARE FACTORS FREQUENCY  PT (By licensed PT), OT (By licensed  OT)     PT Frequency: 5 OT Frequency: 5            Contractures      Additional Factors Info  Code Status, Allergies Code Status Info: Full Code. Allergies Info: Alendronate, Codeine, Ibandronic Acid, Penicillins           Current Medications (05/22/2019):  This is the current hospital active medication list Current Facility-Administered Medications  Medication Dose Route Frequency Provider Last Rate Last Dose  . 0.9 %  sodium chloride infusion   Intravenous Continuous Harrie Foreman, MD 100 mL/hr at 05/22/19 1401    . [MAR Hold] acetaminophen (TYLENOL) tablet 650 mg  650 mg Oral Q6H PRN Harrie Foreman, MD       Or  . Doug Sou Hold] acetaminophen (TYLENOL) suppository 650 mg  650 mg Rectal Q6H PRN Harrie Foreman, MD      . Doug Sou Hold] clindamycin (CLEOCIN) IVPB 600 mg  600 mg Intravenous Once Hessie Knows, MD      . Doug Sou Hold] docusate sodium (COLACE) capsule 100 mg  100 mg Oral BID Harrie Foreman, MD      . Doug Sou Hold] donepezil (ARICEPT) tablet 10 mg  10 mg Oral QHS Harrie Foreman, MD      . Doug Sou Hold] fluticasone [facility]) 50 MCG/ACT nasal spray 1 spray  1 spray Each Nare Daily Harrie Foreman, MD      . Doug Sou Hold] ketorolac (TORADOL) 15 MG/ML injection 15 mg  15 mg Intravenous Q6H PRN Lang Snow, NP      . [  MAR Hold] morphine 2 MG/ML injection 1 mg  1 mg Intravenous Q3H PRN Harrie Foreman, MD   1 mg at 05/22/19 1310  . [MAR Hold] ondansetron (ZOFRAN) tablet 4 mg  4 mg Oral Q6H PRN Harrie Foreman, MD       Or  . Doug Sou Hold] ondansetron [facility]) injection 4 mg  4 mg Intravenous Q6H PRN Harrie Foreman, MD      . Doug Sou Hold] oxyCODONE-acetaminophen (PERCOCET/ROXICET) 5-325 MG per tablet 1 tablet  1 tablet Oral Q4H PRN Harrie Foreman, MD      . Doug Sou Hold] rOPINIRole (REQUIP) tablet 0.25 mg  0.25 mg Oral QHS Harrie Foreman, MD         Discharge Medications: Please see discharge summary for a list of discharge  medications.  Relevant Imaging Results:  Relevant Lab Results:   Additional Information SSN: [id number]  Reino Lybbert, Veronia Beets, LCSW

## 2019-05-22 NOTE — H&P (Signed)
Cynthia Hart is an 85 y.o. female.   Chief Complaint: Fall HPI: The patient with past medical history of hypertension and diet-controlled diabetes presents to the emergency department following a mechanical fall.  The patient was getting ready for bed when she tripped and fell on her right side.  She immediately felt pain in her right leg and was unable to walk.  The patient denies loss of consciousness or hitting her head when she fell.  Imaging in the emergency department revealed right and trochanteric fracture.  Orthopedic surgery was notified and hospitalist service was called for admission.  Past Medical History:  Diagnosis Date  . Diabetes ([place])   . Hypertension   . Osteoarthritis   . Osteoporosis   . Postmenopausal   . Tumor   . Vitamin D deficiency     Past Surgical History:  Procedure Laterality Date  . CATARACT EXTRACTION    . CHOLECYSTECTOMY      Family History  Problem Relation Age of Onset  . Breast cancer Mother   . Dementia Brother   . Heart disease Maternal Grandmother   . Hypertension Paternal Grandfather    Social History:  reports that she has never smoked. She has never used smokeless tobacco. She reports previous alcohol use. She reports that she does not use drugs.  Allergies:  Allergies  Allergen Reactions  . Alendronate Other (See Comments)    From Fosamax-Stomach Pain  . Codeine     Nausea   . Ibandronic Acid Other (See Comments)    From Boniva-Stomach Pain  . Penicillins     Nausea,rash and vomiting     Medications Prior to Admission  Medication Sig Dispense Refill  . acetaminophen (TYLENOL) 325 MG tablet Take 650 mg by mouth every 6 (six) hours as needed.    . Cholecalciferol (VITAMIN D3) 50000 units CAPS Take 1 capsule by mouth every 30 (thirty) days.     Marland Kitchen donepezil (ARICEPT) 10 MG tablet Take 10 mg by mouth at bedtime.    . fluticasone (FLONASE) 50 MCG/ACT nasal spray Place 1 spray into both nostrils daily.    Marland Kitchen rOPINIRole (REQUIP)  0.25 MG tablet Take 1 tablet by mouth at bedtime.      Results for orders placed or performed during the hospital encounter of 05/22/19 (from the past 48 hour(s))  Comprehensive metabolic panel     Status: Abnormal   Collection Time: 05/22/19 12:21 AM  Result Value Ref Range   Sodium 140 135 - 145 mmol/L   Potassium 3.9 3.5 - 5.1 mmol/L   Chloride 105 98 - 111 mmol/L   CO2 26 22 - 32 mmol/L   Glucose, Bld 151 (H) 70 - 99 mg/dL   BUN 25 (H) 8 - 23 mg/dL   Creatinine, Ser 0.94 0.44 - 1.00 mg/dL   Calcium 8.9 8.9 - 10.3 mg/dL   Total Protein 6.8 6.5 - 8.1 g/dL   Albumin 3.4 (L) 3.5 - 5.0 g/dL   AST 19 15 - 41 U/L   ALT 13 0 - 44 U/L   Alkaline Phosphatase 72 38 - 126 U/L   Total Bilirubin 0.3 0.3 - 1.2 mg/dL   GFR calc non Af Amer 53 (L) >60 mL/min   GFR calc Af Amer >60 >60 mL/min   Anion gap 9 5 - 15    Comment: Performed at [facility], [address] [place],  [address]  CBC WITH DIFFERENTIAL     Status: Abnormal   Collection Time: 05/22/19  12:21 AM  Result Value Ref Range   WBC 8.7 4.0 - 10.5 K/uL   RBC 3.82 (L) 3.87 - 5.11 MIL/uL   Hemoglobin 12.0 12.0 - 15.0 g/dL   HCT 37.3 36.0 - 46.0 %   MCV 97.6 80.0 - 100.0 fL   MCH 31.4 26.0 - 34.0 pg   MCHC 32.2 30.0 - 36.0 g/dL   RDW 13.3 11.5 - 15.5 %   Platelets 243 150 - 400 K/uL   nRBC 0.0 0.0 - 0.2 %   Neutrophils Relative % 58 %   Neutro Abs 5.1 1.7 - 7.7 K/uL   Lymphocytes Relative 27 %   Lymphs Abs 2.3 0.7 - 4.0 K/uL   Monocytes Relative 13 %   Monocytes Absolute 1.2 (H) 0.1 - 1.0 K/uL   Eosinophils Relative 1 %   Eosinophils Absolute 0.1 0.0 - 0.5 K/uL   Basophils Relative 0 %   Basophils Absolute 0.0 0.0 - 0.1 K/uL   Immature Granulocytes 1 %   Abs Immature Granulocytes 0.04 0.00 - 0.07 K/uL    Comment: Performed at [facility], [address] [place], [place] [address]  APTT     Status: None   Collection Time: 05/22/19 12:21 AM  Result Value Ref Range   aPTT 32 24 - 36 seconds     Comment: Performed at [facility], [address] [place], [place] [address]  Protime-INR     Status: None   Collection Time: 05/22/19 12:21 AM  Result Value Ref Range   Prothrombin Time 13.2 11.4 - 15.2 seconds   INR 1.0 0.8 - 1.2    Comment: (NOTE) INR goal varies based on device and disease states. Performed at [facility], [address] [place], [place] [address]   TSH     Status: Abnormal   Collection Time: 05/22/19 12:21 AM  Result Value Ref Range   TSH 5.177 (H) 0.350 - 4.500 uIU/mL    Comment: Performed by a 3rd Generation assay with a functional sensitivity of <=0.01 uIU/mL. Performed at [facility], [address] [place], [place] [address]   Type and screen     Status: None   Collection Time: 05/22/19  1:28 AM  Result Value Ref Range   ABO/RH(D) O POS    Antibody Screen NEG    Sample Expiration      05/25/2019,2359 Performed at [facility], [address] [place], [place] [address]   SARS Coronavirus 2 (CEPHEID - Performed in [facility] hospital lab), Hosp Order     Status: None   Collection Time: 05/22/19  1:40 AM   Specimen: Nasopharyngeal Swab  Result Value Ref Range   SARS Coronavirus 2 NEGATIVE NEGATIVE    Comment: (NOTE) If result is NEGATIVE SARS-CoV-2 target nucleic acids are NOT DETECTED. The SARS-CoV-2 RNA is generally detectable in upper and lower  respiratory specimens during the acute phase of infection. The lowest  concentration of SARS-CoV-2 viral copies this assay can detect is 250  copies / mL. A negative result does not preclude SARS-CoV-2 infection  and should not be used as the sole basis for treatment or other  patient management decisions.  A negative result may occur with  improper specimen collection / handling, submission of specimen other  than nasopharyngeal swab, presence of viral mutation(s) within the  areas targeted by this assay, and inadequate number of viral copies  (<250  copies / mL). A negative result must be combined with clinical  observations, patient history, and epidemiological information. If result is POSITIVE SARS-CoV-2 target nucleic acids are DETECTED. The SARS-CoV-2 RNA is generally detectable in upper and lower  respiratory specimens dur ing the acute phase of infection.  Positive  results are indicative of active infection with SARS-CoV-2.  Clinical  correlation with patient history and other diagnostic information is  necessary to determine patient infection status.  Positive results do  not rule out bacterial infection or co-infection with other viruses. If result is PRESUMPTIVE POSTIVE SARS-CoV-2 nucleic acids MAY BE PRESENT.   A presumptive positive result was obtained on the submitted specimen  and confirmed on repeat testing.  While 2019 novel coronavirus  (SARS-CoV-2) nucleic acids may be present in the submitted sample  additional confirmatory testing may be necessary for epidemiological  and / or clinical management purposes  to differentiate between  SARS-CoV-2 and other Sarbecovirus currently known to infect humans.  If clinically indicated additional testing with an alternate test  methodology [phone]) is advised. The SARS-CoV-2 RNA is generally  detectable in upper and lower respiratory sp ecimens during the acute  phase of infection. The expected result is Negative. Fact Sheet for Patients:  StrictlyIdeas.no Fact Sheet for Healthcare Providers: BankingDealers.co.za This test is not yet approved or cleared by the [place] FDA and has been authorized for detection and/or diagnosis of SARS-CoV-2 by FDA under an Emergency Use Authorization (EUA).  This EUA will remain in effect (meaning this test can be used) for the duration of the COVID-19 declaration under Section 564(b)(1) of the Act, 21 U.S.C. section 360bbb-3(b)(1), unless the authorization is terminated or revoked  sooner. Performed at [facility], [address], [place], [place] [address]   Surgical pcr screen     Status: None   Collection Time: 05/22/19  3:31 AM   Specimen: Nasal Mucosa; Nasal Swab  Result Value Ref Range   MRSA, PCR NEGATIVE NEGATIVE   Staphylococcus aureus NEGATIVE NEGATIVE    Comment: (NOTE) The Xpert SA Assay (FDA approved for NASAL specimens in patients 23 years of age and older), is one component of a comprehensive surveillance program. It is not intended to diagnose infection nor to guide or monitor treatment. Performed at [facility], [address], [place], [place] [address]    Dg Chest 1 View  Result Date: 05/22/2019 CLINICAL DATA:  85 year old female with right femoral neck fracture. Preop radiograph. EXAM: CHEST  1 VIEW COMPARISON:  None. FINDINGS: Minimal left lung base atelectatic changes. No focal consolidation, pleural effusion, or pneumothorax. Mild cardiomegaly. Atherosclerotic calcification of the aortic arch. The aorta is tortuous. No acute osseous pathology. Osteopenia. IMPRESSION: No active disease. Electronically Signed   By: Anner Crete M.D.   On: 05/22/2019 01:21   Dg Hip Unilat W Or Wo Pelvis 2-3 Views Right  Result Date: 05/22/2019 CLINICAL DATA:  85 year old female with fall and right hip pain. EXAM: DG HIP (WITH OR WITHOUT PELVIS) 2-3V RIGHT COMPARISON:  None. FINDINGS: There is a comminuted, mildly displaced intertrochanteric fracture of the right femur. There is no dislocation. The bones are osteopenic. IMPRESSION: Comminuted, mildly displaced intertrochanteric fracture of the right femur. Electronically Signed   By: Anner Crete M.D.   On: 05/22/2019 01:18    Review of Systems  Constitutional: Negative for chills and fever.  HENT: Negative for sore throat and tinnitus.   Eyes: Negative for blurred vision and redness.  Respiratory: Negative for cough and shortness of breath.   Cardiovascular: Negative for chest  pain,  palpitations, orthopnea and PND.  Gastrointestinal: Negative for abdominal pain, diarrhea, nausea and vomiting.  Genitourinary: Negative for dysuria, frequency and urgency.  Musculoskeletal: Positive for falls and joint pain. Negative for myalgias.  Skin: Negative for rash.       No lesions  Neurological: Negative for speech change, focal weakness and weakness.  Endo/Heme/Allergies: Does not bruise/bleed easily.       No temperature intolerance  Psychiatric/Behavioral: Negative for depression and suicidal ideas.    Blood pressure 111/84, pulse (!) 51, temperature 98 F (36.7 C), resp. rate 14, height 5\' 7"  (1.702 m), weight 56 kg, SpO2 96 %. Physical Exam  Vitals reviewed. Constitutional: She is oriented to person, place, and time. She appears well-developed and well-nourished. No distress.  HENT:  Head: Normocephalic and atraumatic.  Mouth/Throat: Oropharynx is clear and moist.  Eyes: Pupils are equal, round, and reactive to light. Conjunctivae and EOM are normal. No scleral icterus.  Neck: Normal range of motion. Neck supple. No JVD present. No tracheal deviation present. No thyromegaly present.  Cardiovascular: Normal rate, regular rhythm and normal heart sounds. Exam reveals no gallop and no friction rub.  No murmur heard. Respiratory: Effort normal and breath sounds normal.  GI: Soft. Bowel sounds are normal. She exhibits no distension. There is no abdominal tenderness.  Genitourinary:    Genitourinary Comments: Deferred   Musculoskeletal:        General: Tenderness and deformity present. No edema.     Comments: Right leg shortened and rotated  Lymphadenopathy:    She has no cervical adenopathy.  Neurological: She is alert and oriented to person, place, and time. No cranial nerve deficit. She exhibits normal muscle tone.  Skin: Skin is warm and dry. No rash noted. No erythema.  Psychiatric: She has a normal mood and affect. Her behavior is normal. Judgment and thought  content normal.     Assessment/Plan This is a 85 year old female admitted for fractured hip. 1.  Hip fracture: Right and trochanteric fracture; manage severe pain with IV morphine.  Orthopedic surgery consulted for repair.  The patient is high risk due to advanced age. 2.  Hypothyroidism: TSH obtained during initial laboratory evaluation is very elevated.  Will start Synthroid. 3.  Restless leg syndrome: Continue Requip 4.  Memory loss: Continue donepezil 5.  Vitamin D deficiency: Continue vitamin D supplementation following surgery 6.  DVT prophylaxis: SCDs 7.  GI prophylaxis: None The patient is a full code.  Time spent on admission orders and patient care approximately 45 minutes  Harrie Foreman, MD 05/22/2019, 7:16 AM

## 2019-05-22 NOTE — Op Note (Signed)
05/22/2019  4:04 PM  PATIENT:  Cynthia Hart  85 y.o. female  PRE-OPERATIVE DIAGNOSIS:  RIGHT HIP FRACTURE intertrochanteric unstable comminuted  POST-OPERATIVE DIAGNOSIS:  RIGHT HIP FRACTURE same  PROCEDURE:  Procedure(s): INTRAMEDULLARY (IM) NAIL INTERTROCHANTRIC (Right)  SURGEON: Laurene Footman, MD  ASSISTANTS: None  ANESTHESIA:   spinal  EBL:  Total I/O In: -  Out: 300 [Urine:200; Blood:100]  BLOOD ADMINISTERED:none  DRAINS: none   LOCAL MEDICATIONS USED:  NONE  SPECIMEN:  No Specimen  DISPOSITION OF SPECIMEN:  N/A  COUNTS:  YES  TOURNIQUET:  * No tourniquets in log *  IMPLANTS: Biomet affixes 11 x 400 130 degree right nail with 150 mm leg screw and 46 mm distal interlocking screw  DICTATION: .Dragon Dictation patient was brought to the operating room and after adequate spinal anesthesia was obtained the patient was placed on the operative table with the left leg in the well-leg holder and right foot in the traction boot.  Traction was applied to through the foot.  C arm was brought in and near anatomic alignment was obtained in both AP and lateral projections with this maneuver.  The hip was then prepped and draped using the barrier drape method.  A proximal incision was made in the subcutaneous tissue spread with a guidewire inserted into the tip of the trochanter proximal reaming was carried out and a guidewire placed down the canal measures made of this for rod length.  Reaming was carried out to 12 and half millimeters and the 11 mm rod was inserted to the appropriate depth.  A lateral incision was made and then a guidewire was placed in a near center center position into the head measurements made drilling placing the leg screw and then traction was released to apply compression through the compression device.  Next the proximal locking knot was placed for the leg screw so it could still slide and compressed but not to come out.  The insertion handle was removed  and AP lateral images saved.  Going distally a oblique screw hole was well identified and small incision made followed by drilling measuring and placing the distal interlocking screw.  AP view of this it was obtained showing good purchase meet in medial and lateral cortex.  The wounds were then irrigated with #1 Vicryl to close the deep fascia approximately 2-0 Vicryl subcutaneously and skin staples followed by honeycomb dressings.  PLAN OF CARE: Continue as inpatient  PATIENT DISPOSITION:  PACU - hemodynamically stable.

## 2019-05-22 NOTE — Consult Note (Signed)
Reason for consult is right comminuted intertrochanteric hip fracture. History: Patient suffered a fall last evening tripping and falling on her right side.  She normally walks fairly well.  And denies recent falls.  No prodromal symptoms.  On examination her right leg is shortened and externally rotated with good pulses and sensation intact.  Skin is intact around the hip.  Radiographs reveal a comminuted unstable intertrochanteric hip fracture without significant osteoarthritis of the hip joint.  Impression is comminuted displaced unstable intertrochanteric hip fracture  Plan is for ORIF with IM rod.  Discussed that this is a fracture that normally does well but she will probably require rehab stay postop.

## 2019-05-22 NOTE — ED Notes (Addendum)
Staff to bedside with pt. Will place foley cath once other staff done assessing/talking with pt.

## 2019-05-22 NOTE — Anesthesia Procedure Notes (Signed)
Spinal  Patient location during procedure: OR Start time: 05/22/2019 2:50 PM End time: 05/22/2019 2:56 PM Staffing Anesthesiologist: Emmie Niemann, MD Resident/CRNA: Caryl Asp, CRNA Performed: resident/CRNA  Preanesthetic Checklist Completed: patient identified, site marked, surgical consent, pre-op evaluation, timeout performed, IV checked, risks and benefits discussed and monitors and equipment checked Spinal Block Patient position: left lateral decubitus Prep: ChloraPrep Patient monitoring: heart rate, continuous pulse ox, blood pressure and cardiac monitor Approach: midline Location: L4-5 Injection technique: single-shot Needle Needle type: Introducer and Pencil-Tip  Needle gauge: 24 G Needle length: 9 cm Additional Notes Negative paresthesia. Negative blood return. Positive free-flowing CSF. Expiration date of kit checked and confirmed. Patient tolerated procedure well, without complications.

## 2019-05-22 NOTE — ED Notes (Signed)
Skin tears assessed, cleansed, neosporin applied, new non-adherent dressings applied.

## 2019-05-22 NOTE — Progress Notes (Signed)
Subjective:Patient is seen at the bedside.  She is complaining of right hip pain rating 10 out of 10 on a pain intensity rating scale.  Brief HPI: Cynthia Hart is an 85 y.o. female with history of diabetes, hypertension, osteoarthritis, osteoporosis, vitamin D deficiency and dementia admitted today with right hip fracture following a mechanical fall.  She was found to have right trochanteric fracture status post repair by Dr. Rudene Christians

## 2019-05-22 NOTE — ED Notes (Signed)
Pt leaving for imaging.

## 2019-05-22 NOTE — Anesthesia Preprocedure Evaluation (Signed)
Anesthesia Evaluation  Patient identified by MRN, date of birth, ID band Patient awake    Reviewed: Allergy & Precautions, NPO status , Patient's Chart, lab work & pertinent test results  History of Anesthesia Complications Negative for: history of anesthetic complications  Airway Mallampati: II  TM Distance: >3 FB Neck ROM: Full    Dental  (+) Poor Dentition   Pulmonary neg pulmonary ROS, neg sleep apnea, neg COPD,    breath sounds clear to auscultation- rhonchi (-) wheezing      Cardiovascular hypertension, (-) CAD, (-) Past MI, (-) Cardiac Stents and (-) CABG  Rhythm:Regular Rate:Normal - Systolic murmurs and - Diastolic murmurs    Neuro/Psych neg Seizures negative neurological ROS  negative psych ROS   GI/Hepatic negative GI ROS, Neg liver ROS,   Endo/Other  diabetes  Renal/GU Renal InsufficiencyRenal disease     Musculoskeletal  (+) Arthritis ,   Abdominal (+) - obese,   Peds  Hematology negative hematology ROS (+)   Anesthesia Other Findings Past Medical History: No date: Diabetes ([place]) No date: Hypertension No date: Osteoarthritis No date: Osteoporosis No date: Postmenopausal No date: Tumor No date: Vitamin D deficiency   Reproductive/Obstetrics                             Lab Results  Component Value Date   WBC 8.7 05/22/2019   HGB 12.0 05/22/2019   HCT 37.3 05/22/2019   MCV 97.6 05/22/2019   PLT 243 05/22/2019    Anesthesia Physical Anesthesia Plan  ASA: III  Anesthesia Plan: Spinal   Post-op Pain Management:    Induction:   PONV Risk Score and Plan: 2 and Propofol infusion  Airway Management Planned: Natural Airway  Additional Equipment:   Intra-op Plan:   Post-operative Plan:   Informed Consent: I have reviewed the patients History and Physical, chart, labs and discussed the procedure including the risks, benefits and alternatives for the proposed  anesthesia with the patient or authorized representative who has indicated his/her understanding and acceptance.     Dental advisory given  Plan Discussed with: CRNA and Anesthesiologist  Anesthesia Plan Comments:         Anesthesia Quick Evaluation

## 2019-05-22 NOTE — ED Notes (Signed)
Pt placed on 2L O2 [place]

## 2019-05-22 NOTE — Transfer of Care (Signed)
Immediate Anesthesia Transfer of Care Note  Patient: Cynthia Hart  Procedure(s) Performed: INTRAMEDULLARY (IM) NAIL INTERTROCHANTRIC (Right Hip)  Patient Location: PACU  Anesthesia Type:Spinal  Level of Consciousness: awake, alert  and oriented  Airway & Oxygen Therapy: Patient Spontanous Breathing and Patient connected to face mask oxygen  Post-op Assessment: Report given to RN and Post -op Vital signs reviewed and stable  Post vital signs: Reviewed and stable  Last Vitals:  Vitals Value Taken Time  BP    Temp    Pulse    Resp    SpO2      Last Pain:  Vitals:   05/22/19 1410  TempSrc: Tympanic  PainSc: 10-Worst pain ever      Patients Stated Pain Goal: 0 (31/43/88 [id number])  Complications: No apparent anesthesia complications

## 2019-05-22 NOTE — ED Notes (Signed)
Pt educated about need for foley cath. Pt agreeable.

## 2019-05-22 NOTE — ED Notes (Signed)
Myah and this RN at bedside to place foley cath.

## 2019-05-22 NOTE — Anesthesia Post-op Follow-up Note (Signed)
Anesthesia QCDR form completed.        

## 2019-05-22 NOTE — TOC Initial Note (Signed)
Transition of Care [facility]) - Initial/Assessment Note    Patient Details  Name: Cynthia Hart MRN: [id number] Date of Birth: Jul 27, 1927  Transition of Care [facility]) CM/SW Contact:    Jilene Spohr, Lenice Llamas Phone Number: [phone]  05/22/2019, 2:46 PM  Clinical Narrative: Clinical Social Worker (CSW) met with patient today alone at bedside prior to surgery. Patient has a hip fracture. CSW introduced self and explained role of CSW department. Patient was alert and oriented X4 and was laying in the bed. Per patient she lives at the [facility] independent living connected to [facility]. Patient reported that her daughter [name] assists her often. CSW explained that after surgery PT will evaluate her and make a recommendation of home health or SNF. CSW explained that patient will likely need to go to SNF and that [facility] will have to approve it. Patient is agreeable to SNF search in [facility]. FL2 complete and faxed out. CSW will continue to follow and assist as needed.                 Expected Discharge Plan: Skilled Nursing Facility Barriers to Discharge: Continued Medical Work up   Patient Goals and CMS Choice Patient states their goals for this hospitalization and ongoing recovery are:: Pain control      Expected Discharge Plan and Services Expected Discharge Plan: [place] In-house Referral: Clinical Social Work Discharge Planning Services: CM Consult Post Acute Care Choice: [place] Living arrangements for the past 2 months: [place]                                      Prior Living Arrangements/Services Living arrangements for the past 2 months: [place] Lives with:: Self Patient language and need for interpreter reviewed:: No Do you feel safe going back to the place where you live?: Yes      Need for Family Participation in Patient Care: Yes (Comment) Care giver support system in place?: No  (comment)   Criminal Activity/Legal Involvement Pertinent to Current Situation/Hospitalization: No - Comment as needed  Activities of Daily Living Home Assistive Devices/Equipment: Walker (specify type)(rolling) ADL Screening (condition at time of admission) Patient's cognitive ability adequate to safely complete daily activities?: Yes Is the patient deaf or have difficulty hearing?: No Does the patient have difficulty seeing, even when wearing glasses/contacts?: No Does the patient have difficulty concentrating, remembering, or making decisions?: No Patient able to express need for assistance with ADLs?: Yes Does the patient have difficulty dressing or bathing?: No Independently performs ADLs?: Yes (appropriate for developmental age) Does the patient have difficulty walking or climbing stairs?: Yes Weakness of Legs: Both Weakness of Arms/Hands: None  Permission Sought/Granted Permission sought to share information with : Chartered certified accountant granted to share information with : Yes, Verbal Permission Granted              Emotional Assessment Appearance:: Appears stated age   Affect (typically observed): Pleasant, Calm Orientation: : Oriented to Self, Oriented to Place, Oriented to  Time, Oriented to Situation Alcohol / Substance Use: Not Applicable Psych Involvement: No (comment)  Admission diagnosis:  Hypoxemia [R09.02] Fall [W19.XXXA] Closed displaced intertrochanteric fracture of right femur, initial encounter [facility]) [S72.141A] Patient Active Problem List   Diagnosis Date Noted  . Hip fracture ([place]) 05/22/2019  . Pre-operative cardiovascular examination 09/05/2018  . 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  Moderate to severe mitral regurgitation 09/05/2018  . Pulmonary hypertension, unspecified ([place]) 09/05/2018  . Postoperative examination 08/14/2018  . 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 Heart murmur 08/11/2018  . Dysphagia 08/11/2018    PCP:  Nicholos Johns, MD Pharmacy:   [place], [place] [address] Phone: [phone] Fax: [phone]     Social Determinants of Health (SDOH) Interventions    Readmission Risk Interventions No flowsheet data found.

## 2019-05-22 NOTE — ED Notes (Signed)
ED TO INPATIENT HANDOFF REPORT  ED Nurse Name and Phone #: Joelene Millin [phone]  S Name/Age/Gender Cynthia Hart 85 y.o. female Room/Bed: ED12A/ED12A  Code Status   Code Status: Not on file  Home/SNF/Other Nursing Home Patient oriented to: self, place, time and situation Is this baseline? Yes   Triage Complete: Triage complete  Chief Complaint Fall ( Hip Pain)  Triage Note Pt in via EMS d/t fall from chair at indep. Living [place]. Pain to R leg/hip 10/10 per pt. Denies hitting head, loc. R arm & left leg skin tears. A&Ox4. 148/60 BP; 70HR; 95% RA with EMS. History of tremor. PT denies being on blood thinners.    Allergies Allergies  Allergen Reactions  . Alendronate Other (See Comments)    From Fosamax-Stomach Pain  . Codeine     Nausea   . Ibandronic Acid Other (See Comments)    From Boniva-Stomach Pain  . Penicillins     Nausea,rash and vomiting     Level of Care/Admitting Diagnosis ED Disposition    ED Disposition Condition [facility] Area: [place] [[id number]]  Level of Care: Med-Surg [16]  Covid Evaluation: Screening Protocol (No Symptoms)  Diagnosis: Hip fracture [facility]) [[id number]]  Admitting Physician: Harrie Foreman [[id number]]  Attending Physician: Harrie Foreman [[id number]]  Estimated length of stay: past midnight tomorrow  Certification:: I certify this patient will need inpatient services for at least 2 midnights  PT Class (Do Not Modify): Inpatient [101]  PT Acc Code (Do Not Modify): Private [1]       B Medical/Surgery History Past Medical History:  Diagnosis Date  . Diabetes ([place])   . Hypertension   . Osteoarthritis   . Osteoporosis   . Postmenopausal   . Tumor   . Vitamin D deficiency    Past Surgical History:  Procedure Laterality Date  . CATARACT EXTRACTION    . CHOLECYSTECTOMY       A IV Location/Drains/Wounds Patient Lines/Drains/Airways Status   Active Line/Drains/Airways    Name:   Placement date:   Placement time:   Site:   Days:   Peripheral IV 05/22/19 Left Antecubital   05/22/19    0012    Antecubital   less than 1   External Urinary Catheter   05/22/19    0237    -   less than 1          Intake/Output Last 24 hours No intake or output data in the 24 hours ending 05/22/19 0300  Labs/Imaging Results for orders placed or performed during the hospital encounter of 05/22/19 (from the past 48 hour(s))  Comprehensive metabolic panel     Status: Abnormal   Collection Time: 05/22/19 12:21 AM  Result Value Ref Range   Sodium 140 135 - 145 mmol/L   Potassium 3.9 3.5 - 5.1 mmol/L   Chloride 105 98 - 111 mmol/L   CO2 26 22 - 32 mmol/L   Glucose, Bld 151 (H) 70 - 99 mg/dL   BUN 25 (H) 8 - 23 mg/dL   Creatinine, Ser 0.94 0.44 - 1.00 mg/dL   Calcium 8.9 8.9 - 10.3 mg/dL   Total Protein 6.8 6.5 - 8.1 g/dL   Albumin 3.4 (L) 3.5 - 5.0 g/dL   AST 19 15 - 41 U/L   ALT 13 0 - 44 U/L   Alkaline Phosphatase 72 38 - 126 U/L   Total Bilirubin 0.3 0.3 - 1.2 mg/dL   GFR calc non  Af Amer 53 (L) >60 mL/min   GFR calc Af Amer >60 >60 mL/min   Anion gap 9 5 - 15    Comment: Performed at [facility], [address] [place], [place] [address]  CBC WITH DIFFERENTIAL     Status: Abnormal   Collection Time: 05/22/19 12:21 AM  Result Value Ref Range   WBC 8.7 4.0 - 10.5 K/uL   RBC 3.82 (L) 3.87 - 5.11 MIL/uL   Hemoglobin 12.0 12.0 - 15.0 g/dL   HCT 37.3 36.0 - 46.0 %   MCV 97.6 80.0 - 100.0 fL   MCH 31.4 26.0 - 34.0 pg   MCHC 32.2 30.0 - 36.0 g/dL   RDW 13.3 11.5 - 15.5 %   Platelets 243 150 - 400 K/uL   nRBC 0.0 0.0 - 0.2 %   Neutrophils Relative % 58 %   Neutro Abs 5.1 1.7 - 7.7 K/uL   Lymphocytes Relative 27 %   Lymphs Abs 2.3 0.7 - 4.0 K/uL   Monocytes Relative 13 %   Monocytes Absolute 1.2 (H) 0.1 - 1.0 K/uL   Eosinophils Relative 1 %   Eosinophils Absolute 0.1 0.0 - 0.5 K/uL   Basophils Relative 0 %   Basophils Absolute 0.0 0.0 - 0.1 K/uL    Immature Granulocytes 1 %   Abs Immature Granulocytes 0.04 0.00 - 0.07 K/uL    Comment: Performed at [facility], [address] [place], [place] [address]  APTT     Status: None   Collection Time: 05/22/19 12:21 AM  Result Value Ref Range   aPTT 32 24 - 36 seconds    Comment: Performed at [facility], [address] [place], [place] [address]  Protime-INR     Status: None   Collection Time: 05/22/19 12:21 AM  Result Value Ref Range   Prothrombin Time 13.2 11.4 - 15.2 seconds   INR 1.0 0.8 - 1.2    Comment: (NOTE) INR goal varies based on device and disease states. Performed at [facility], [address] [place], [place] [address]   Type and screen     Status: None   Collection Time: 05/22/19  1:28 AM  Result Value Ref Range   ABO/RH(D) O POS    Antibody Screen NEG    Sample Expiration      05/25/2019,2359 Performed at [facility], [address] [place], [place] [address]    Dg Chest 1 View  Result Date: 05/22/2019 CLINICAL DATA:  85 year old female with right femoral neck fracture. Preop radiograph. EXAM: CHEST  1 VIEW COMPARISON:  None. FINDINGS: Minimal left lung base atelectatic changes. No focal consolidation, pleural effusion, or pneumothorax. Mild cardiomegaly. Atherosclerotic calcification of the aortic arch. The aorta is tortuous. No acute osseous pathology. Osteopenia. IMPRESSION: No active disease. Electronically Signed   By: Anner Crete M.D.   On: 05/22/2019 01:21   Dg Hip Unilat W Or Wo Pelvis 2-3 Views Right  Result Date: 05/22/2019 CLINICAL DATA:  85 year old female with fall and right hip pain. EXAM: DG HIP (WITH OR WITHOUT PELVIS) 2-3V RIGHT COMPARISON:  None. FINDINGS: There is a comminuted, mildly displaced intertrochanteric fracture of the right femur. There is no dislocation. The bones are osteopenic. IMPRESSION: Comminuted, mildly displaced intertrochanteric fracture of the right femur. Electronically  Signed   By: Anner Crete M.D.   On: 05/22/2019 01:18    Pending Labs [employer] (From admission, onward)    Start     Ordered  05/22/19 0136  SARS Coronavirus 2 (CEPHEID - Performed in [place] hospital lab), Hosp Order  (Asymptomatic Patients Labs)  Once,   STAT    Question:  Rule Out  Answer:  Yes   05/22/19 0135   Signed and Held  TSH  Add-on,   R     Signed and Held   Signed and Held  Hemoglobin A1c  Add-on,   R     Signed and Held          Vitals/Pain Today's Vitals   05/22/19 0145 05/22/19 0200 05/22/19 0204 05/22/19 0240  BP:  (!) 142/75    Pulse:  69    Resp: (!) 21 (!) 26    Temp:      SpO2: 99% 97%    Weight:      Height:      PainSc:   5  10-Worst pain ever    Isolation Precautions No active isolations  Medications Medications  morphine 4 MG/ML injection 4 mg (4 mg Intravenous Given 05/22/19 0054)  morphine 2 MG/ML injection 1 mg (1 mg Intravenous Given 05/22/19 0243)  ondansetron (ZOFRAN) injection 4 mg (4 mg Intravenous Given 05/22/19 0054)  neomycin-bacitracin-polymyxin (NEOSPORIN) ointment packet (2 application Topical Given 05/22/19 0124)    Mobility walks Moderate fall risk   Focused Assessments hip fracture   R Recommendations: See Admitting Provider Note  Report given to:   Additional Notes:

## 2019-05-22 NOTE — ED Provider Notes (Signed)
[facility] Emergency Department Provider Note  ____________________________________________   First MD Initiated Contact with Patient 05/22/19 0023     (approximate)  I have reviewed the triage vital signs and the nursing notes.   HISTORY  Chief Complaint Fall    HPI Cynthia Hart is a 85 y.o. female with medical history as listed below and who takes no anticoagulation and presents by EMS from independent living facility for evaluation of acute onset and severe pain in her right hip.  She reports that she was just walking and tripped on something and fell and had acute onset of severe pain.  Moving the leg makes it worse and nothing particular makes it better.  She did not sustain any other injuries and denies headache, neck pain, and pain in her arms although she does have a skin tear on her right forearm which was bandaged by the ED nurse after evaluation.  She has no numbness or tingling in her leg and is able to move her feet and toes.  She denies any recent exposure to COVID-19.  She denies fever/chills, sore throat, cough, chest pain, shortness of breath, nausea, vomiting, abdominal pain, and dysuria.  Of note she was slightly hypoxemic upon her arrival but she has no subjective shortness of breath and no history of lung disease.  She has not had orthopedic injuries in the past.         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 Authorizing Provider  acetaminophen (TYLENOL) 325 MG tablet Take 650 mg by mouth every 6 (six) hours as needed.   Yes [provider]  Cholecalciferol (VITAMIN D3) 50000 units CAPS Take 1 capsule by mouth every 30 (thirty) days.    Yes [provider]  donepezil (ARICEPT) 10 MG tablet Take 10 mg by mouth at bedtime. 11/17/18 04/01/20 Yes [provider]  rOPINIRole (REQUIP) 0.25 MG tablet Take 1 tablet by mouth at bedtime. 07/31/18  Yes [provider]    Allergies Alendronate, Codeine, Ibandronic acid, and Penicillins  Family History  Problem Relation Age of Onset   Breast cancer Mother    Dementia Brother    Heart disease Maternal Grandmother    Hypertension Paternal Grandfather     Social History Social History   Tobacco Use   Smoking status: Never Smoker   Smokeless tobacco: Never Used  Substance Use Topics   Alcohol use: Not Currently   Drug use: Never    Review of Systems Constitutional: No fever/chills Eyes: No visual changes. ENT: No sore throat. Cardiovascular: Denies chest pain. Respiratory: Denies shortness of breath. Gastrointestinal: No abdominal pain.  No nausea, no vomiting.  No diarrhea.  No constipation. Genitourinary: Negative for dysuria. Musculoskeletal: Acute onset severe pain in the  right hip as described above. Integumentary: Negative for rash. Neurological: Negative for headaches, focal weakness or numbness.   ____________________________________________   PHYSICAL EXAM:  VITAL SIGNS: ED Triage Vitals  Enc Vitals Group     BP 05/22/19 0008 139/86     Pulse Rate 05/22/19 0009 63     Resp 05/22/19 0008 (!) 21     Temp 05/22/19 0013 98 F (36.7 C)     Temp src --      SpO2 05/22/19 0016 94 %     Weight 05/22/19 0016  54.4 kg (120 lb)     Height 05/22/19 0016 1.702 m (5\' 7" )     Head Circumference --      Peak Flow --      Pain Score 05/22/19 0016 10     Pain Loc --      Pain Edu? --      Excl. in [place]? --     Constitutional: Alert and oriented.  Generally well-appearing but does appear to be in pain. Eyes: Conjunctivae are normal.  Head: Atraumatic. Nose: No congestion/rhinnorhea. Mouth/Throat: Mucous membranes are moist. Neck: No stridor.  No meningeal signs.   Cardiovascular: Normal rate, regular rhythm. Good peripheral circulation. Grossly normal heart sounds. Respiratory: Normal respiratory effort.  No retractions. No audible wheezing. Gastrointestinal: Soft and nontender. No distention.  Musculoskeletal: Shortness and external rotation of the right hip consistent with right femur fracture.  Neurovascularly intact. Neurologic:  Normal speech and language. No gross focal neurologic deficits are appreciated.  Skin:  Skin is warm, dry and intact except for skin tear on right forearm that does not require sutures but will require wound care. Psychiatric: Mood and affect are normal. Speech and behavior are normal.  ____________________________________________   LABS (all labs ordered are listed, but only abnormal results are displayed)  Labs Reviewed  COMPREHENSIVE METABOLIC PANEL - Abnormal; Notable for the following components:      Result Value   Glucose, Bld 151 (*)    BUN 25 (*)    Albumin 3.4 (*)    GFR calc non Af Amer 53 (*)    All other components within normal limits  CBC WITH DIFFERENTIAL/PLATELET - Abnormal; Notable for the following components:   RBC 3.82 (*)    Monocytes Absolute 1.2 (*)    All other components within normal limits  SARS CORONAVIRUS 2 (HOSPITAL ORDER, [place] [facility])  APTT  PROTIME-INR  TYPE AND SCREEN   ____________________________________________  EKG  ED ECG REPORT I, Hinda Kehr, the attending physician, personally viewed  and interpreted this ECG.  Date: 05/22/2019 EKG Time: 00: 09 Rate: 63 Rhythm: normal sinus rhythm QRS Axis: normal Intervals: normal ST/T Wave abnormalities: Non-specific ST segment / T-wave changes, but no clear evidence of acute ischemia. Narrative Interpretation: no definitive evidence of acute ischemia; does not meet STEMI criteria.  Initially it was interpreted by the computer as A. fib but that was because of artifact and some of the leads.  However the lead to baseline was consistent and stable with normal sinus rhythm.   ____________________________________________  RADIOLOGY I, Hinda Kehr, personally viewed and evaluated these images (plain radiographs) as part of my medical decision making, as well as reviewing the written report by the radiologist.  ED MD interpretation: No acute abnormalities on chest x-ray.  Comminuted and mildly displaced intertrochanteric fracture of the right femur.  Official radiology report(s): Dg Chest 1 View  Result Date: 05/22/2019 CLINICAL DATA:  85 year old female  with right femoral neck fracture. Preop radiograph. EXAM: CHEST  1 VIEW COMPARISON:  None. FINDINGS: Minimal left lung base atelectatic changes. No focal consolidation, pleural effusion, or pneumothorax. Mild cardiomegaly. Atherosclerotic calcification of the aortic arch. The aorta is tortuous. No acute osseous pathology. Osteopenia. IMPRESSION: No active disease. Electronically Signed   By: Anner Crete M.D.   On: 05/22/2019 01:21   Dg Hip Unilat W Or Wo Pelvis 2-3 Views Right  Result Date: 05/22/2019 CLINICAL DATA:  85 year old female with fall and right hip pain. EXAM: DG HIP (WITH OR WITHOUT PELVIS) 2-3V RIGHT COMPARISON:  None. FINDINGS: There is a comminuted, mildly displaced intertrochanteric fracture of the right femur. There is no dislocation. The bones are osteopenic. IMPRESSION: Comminuted, mildly displaced intertrochanteric fracture of the right femur. Electronically Signed    By: Anner Crete M.D.   On: 05/22/2019 01:18    ____________________________________________   PROCEDURES   Procedure(s) performed (including Critical Care):  Procedures   ____________________________________________   INITIAL IMPRESSION / MDM / [place] / ED COURSE  As part of my medical decision making, I reviewed the following data within the [place] notes reviewed and incorporated, Labs reviewed , EKG interpreted , Old chart reviewed, Radiograph reviewed , Discussed with orthopedics (Dr. Rudene Christians), Discussed with admitting physician (Dr. Jannifer Franklin and Dr. Marcille Blanco) and Notes from prior ED visits      *Breslyn Rickerson was evaluated in Emergency Department on 05/22/2019 for the symptoms described in the history of present illness. She was evaluated in the context of the global COVID-19 pandemic, which necessitated consideration that the patient might be at risk for infection with the SARS-CoV-2 virus that causes COVID-19. Institutional protocols and algorithms that pertain to the evaluation of patients at risk for COVID-19 are in a state of rapid change based on information released by regulatory bodies including the CDC and federal and state organizations. These policies and algorithms were followed during the patient's care in the ED.  Some ED evaluations and interventions may be delayed as a result of limited staffing during the pandemic.*  Differential diagnosis includes, but is not limited to, hip fracture or dislocation, musculoskeletal pain/strain, muscular hematoma, pelvic fracture, acute intracranial injury, other orthopedic injury.  The patient is generally well-appearing in spite of the obvious pain from her right hip.  She has a mildly displaced and comminuted right intertrochanteric femur fracture.  I will speak with orthopedics and then admitted to the hospitalist service.  She is not on anticoagulation.  Her chest x-ray and EKG are  reassuring.  Lab work is also reassuring with an essentially normal comprehensive metabolic panel, normal coagulation studies, and normal CBC.  Foley catheter will be placed for comfort and for perioperative status.  Clinical Course as of May 21 541  Fri May 22, 2019  0135 Paging Dr. Rudene Christians.  I have also ordered the rapid coronavirus test in order to prepare the patient for surgery.   [CF]  0211 I spoke by phone with Dr. Rudene Christians who will put her on his list for the OR.  I also contacted Dr. Jannifer Franklin and Dr. Marcille Blanco with the hospitalist service who will admit.   [CF]  0246 Daughter called and I updated her about results and plan of care   [CF]    Clinical Course User Index [CF] Hinda Kehr, MD     ____________________________________________  FINAL CLINICAL IMPRESSION(S) / ED DIAGNOSES  Final diagnoses:  Closed displaced intertrochanteric fracture of right femur, initial encounter (  [place])  Hypoxemia     MEDICATIONS GIVEN DURING THIS VISIT:  Medications  morphine 4 MG/ML injection 4 mg (4 mg Intravenous Given 05/22/19 0054)  ondansetron (ZOFRAN) injection 4 mg (4 mg Intravenous Given 05/22/19 0054)  neomycin-bacitracin-polymyxin (NEOSPORIN) ointment packet (2 application Topical Given 05/22/19 0124)     ED Discharge Orders    None       Note:  This document was prepared using Dragon voice recognition software and may include unintentional dictation errors.   Hinda Kehr, MD 05/22/19 [phone]

## 2019-05-22 NOTE — ED Notes (Signed)
Pt denies dizziness, CP, LOC, hitting head. States fell d/t losing balance. Pulse at R ankle 2+. Cap refill, color, warmth equal bilat.

## 2019-05-22 NOTE — ED Triage Notes (Signed)
Pt in via EMS d/t fall from chair at indep. Living [place]. Pain to R leg/hip 10/10 per pt. Denies hitting head, loc. R arm & left leg skin tears. A&Ox4. 148/60 BP; 70HR; 95% RA with EMS. History of tremor. PT denies being on blood thinners.

## 2019-05-22 NOTE — ED Notes (Addendum)
16 Fr Foley cath in place. Myah NT assisted. Urine noted immediately upon placement.

## 2019-05-23 LAB — CBC
HCT: 24.3 % — ABNORMAL LOW (ref 36.0–46.0)
Hemoglobin: 7.7 g/dL — ABNORMAL LOW (ref 12.0–15.0)
MCH: 31.8 pg (ref 26.0–34.0)
MCHC: 31.7 g/dL (ref 30.0–36.0)
MCV: 100.4 fL — ABNORMAL HIGH (ref 80.0–100.0)
Platelets: 149 10*3/uL — ABNORMAL LOW (ref 150–400)
RBC: 2.42 MIL/uL — ABNORMAL LOW (ref 3.87–5.11)
RDW: 13.6 % (ref 11.5–15.5)
WBC: 7.1 10*3/uL (ref 4.0–10.5)
nRBC: 0 % (ref 0.0–0.2)

## 2019-05-23 LAB — BASIC METABOLIC PANEL
Anion gap: 5 (ref 5–15)
BUN: 27 mg/dL — ABNORMAL HIGH (ref 8–23)
CO2: 25 mmol/L (ref 22–32)
Calcium: 7.9 mg/dL — ABNORMAL LOW (ref 8.9–10.3)
Chloride: 110 mmol/L (ref 98–111)
Creatinine, Ser: 0.6 mg/dL (ref 0.44–1.00)
GFR calc Af Amer: >60 mL/min (ref >60)
GFR calc non Af Amer: >60 mL/min (ref >60)
Glucose, Bld: 131 mg/dL — ABNORMAL HIGH (ref 70–99)
Potassium: 4.3 mmol/L (ref 3.5–5.1)
Sodium: 140 mmol/L (ref 135–145)

## 2019-05-23 LAB — HEMOGLOBIN A1C
Hgb A1c MFr Bld: 5.8 % — ABNORMAL HIGH (ref 4.8–5.6)
Mean Plasma Glucose: 120 mg/dL

## 2019-05-23 LAB — T4, FREE: Free T4: 0.81 ng/dL (ref 0.61–1.12)

## 2019-05-23 NOTE — Progress Notes (Signed)
  Subjective: 1 Day Post-Op Procedure(s) (LRB): INTRAMEDULLARY (IM) NAIL INTERTROCHANTRIC (Right) Patient reports pain as mild.   Does report pain in the heel of the right foot. Patient is well, and has had no acute complaints or problems PT and care management to assist with discharge planning. Negative for chest pain and shortness of breath Fever: no Gastrointestinal:Negative for nausea and vomiting  Objective: Vital signs in last 24 hours: Temp:  [97.1 F (36.2 C)-98.9 F (37.2 C)] 97.6 F (36.4 C) (06/27 0525) Pulse Rate:  [40-81] 72 (06/27 0525) Resp:  [16-22] 16 (06/27 0525) BP: (88-123)/(50-69) 99/55 (06/27 0525) SpO2:  [87 %-100 %] 94 % (06/27 0525) Weight:  [56 kg] 56 kg (06/26 1410)  Intake/Output from previous day:  Intake/Output Summary (Last 24 hours) at 05/23/2019 0836 Last data filed at 05/23/2019 0413 Gross per 24 hour  Intake 1483.22 ml  Output 900 ml  Net 583.22 ml    Intake/Output this shift: No intake/output data recorded.  Labs: Recent Labs    05/22/19 0021 05/23/19 0615  HGB 12.0 7.7*   Recent Labs    05/22/19 0021 05/23/19 0615  WBC 8.7 7.1  RBC 3.82* 2.42*  HCT 37.3 24.3*  PLT 243 149*   Recent Labs    05/22/19 0021 05/23/19 0615  NA 140 140  K 3.9 4.3  CL 105 110  CO2 26 25  BUN 25* 27*  CREATININE 0.94 0.60  GLUCOSE 151* 131*  CALCIUM 8.9 7.9*   Recent Labs    05/22/19 0021  INR 1.0     EXAM General - Patient is Alert, Appropriate and Oriented Extremity - ABD soft Neurovascular intact Sensation intact distally Intact pulses distally Dorsiflexion/Plantar flexion intact Incision: dressing C/D/I No cellulitis present  No swelling appreciated to the right ankle, no lateral or medial joint line tenderness.  Mild tenderness with palpation over the heel of the right foot, but not evidence of skin breakdown. Dressing/Incision - clean, dry, no drainage Motor Function - intact, moving foot and toes well on exam.  Abdomen  soft with normal bowel sounds.  Past Medical History:  Diagnosis Date  . Diabetes ([place])   . Hypertension   . Osteoarthritis   . Osteoporosis   . Postmenopausal   . Tumor   . Vitamin D deficiency    Assessment/Plan: 1 Day Post-Op Procedure(s) (LRB): INTRAMEDULLARY (IM) NAIL INTERTROCHANTRIC (Right) Active Problems:   Hip fracture (HCC)  Estimated body mass index is 19.34 kg/m as calculated from the following:   Height as of this encounter: 5\' 7"  (1.702 m).   Weight as of this encounter: 56 kg. Advance diet Up with therapy D/C IV fluids when tolerating po intake.  Labs reviewed this AM, Hg 7.7, BP 99/55 this AM. Continue IVF at this time, will see how she responds to therapy today. If continued ankle pain will obtain an x-ray. PT and Care management to assist with discharge planning. CBC and BMP ordered for tomorrow morning.  DVT Prophylaxis - Lovenox, Foot Pumps and TED hose Weight-Bearing as tolerated to right leg  J. Cameron Proud, PA-C [facility] Orthopaedic Surgery 05/23/2019, 8:36 AM

## 2019-05-23 NOTE — Anesthesia Postprocedure Evaluation (Signed)
Anesthesia Post Note  Patient: Sport and exercise psychologist  Procedure(s) Performed: INTRAMEDULLARY (IM) NAIL INTERTROCHANTRIC (Right Hip)  Patient location during evaluation: Nursing Unit Anesthesia Type: Spinal Level of consciousness: oriented and awake and alert Pain management: pain level controlled Vital Signs Assessment: post-procedure vital signs reviewed and stable Respiratory status: spontaneous breathing and respiratory function stable Cardiovascular status: blood pressure returned to baseline and stable Postop Assessment: no headache, no backache, no apparent nausea or vomiting and patient able to bend at knees Anesthetic complications: no     Last Vitals:  Vitals:   05/23/19 0525 05/23/19 0850  BP: (!) 99/55 103/73  Pulse: 72 75  Resp: 16 16  Temp: 36.4 C 36.6 C  SpO2: 94% (!) 89%    Last Pain:  Vitals:   05/23/19 0850  TempSrc: Oral  PainSc:                  Maebell Lyvers K

## 2019-05-23 NOTE — Evaluation (Signed)
Physical Therapy Evaluation Patient Details Name: Cynthia Hart MRN: [id number] DOB: 04-23-1927 Today's Date: 05/23/2019   History of Present Illness  presented to ER status post mechanical fall with acute R hip pain; admitted with comminuted, displaced introchanteric fracture of R hip, s/p IM nailing (05/22/19), WBAT.  Clinical Impression  Upon evaluation, patient alert and oriented; follows simple commands and participates well with session as able.  Generally limited by R hip pain (8/10); meds requested per RN.  R hip strength (3-/5) and ROM very guarded and painful, requiring act assist for any movement throughout all planes. Unable to tolerate attempts at bed mobility or OOB at this time (though do anticipate significant physical assist required).  Will continue mobility assessment/progression in subsequent sessions as medically appropriate and patient able to tolerate. Would benefit from skilled PT to address above deficits and promote optimal return to PLOF; recommend transition to STR upon discharge from acute hospitalization.     Follow Up Recommendations SNF    Equipment Recommendations       Recommendations for Other Services       Precautions / Restrictions Precautions Precautions: Fall Restrictions Weight Bearing Restrictions: Yes RLE Weight Bearing: Weight bearing as tolerated      Mobility  Bed Mobility               General bed mobility comments: unable to tolerate due to pain in R hip  Transfers                 General transfer comment: unable to tolerate due to pain in R hip  Ambulation/Gait             General Gait Details: unable to tolerate due to pain in R hip  Stairs            Wheelchair Mobility    Modified Rankin (Stroke Patients Only)       Balance                                             Pertinent Vitals/Pain Pain Assessment: Faces Faces Pain Scale: Hurts whole lot Pain Location: R  hip Pain Descriptors / Indicators: Aching;Guarding;Grimacing Pain Intervention(s): Limited activity within patient's tolerance    Home Living Family/patient expects to be discharged to:: [facility] independent living)                      Prior Function Level of Independence: Independent with assistive device(s)         Comments: Mod indep wiht 4WRW for ADLs, household and limited community mobilization     Hand Dominance   Dominant Hand: Right    Extremity/Trunk Assessment   Upper Extremity Assessment Upper Extremity Assessment: Overall WFL for tasks assessed    Lower Extremity Assessment Lower Extremity Assessment: (R hip grossly 3-/5, limited by pain, R knee and ankle 4/5; L LE grossly 4+/5)       Communication   Communication: No difficulties  Cognition Arousal/Alertness: Awake/alert Behavior During Therapy: WFL for tasks assessed/performed Overall Cognitive Status: Within Functional Limits for tasks assessed                                        General Comments      Exercises Other Exercises  Other Exercises: Supine R LE therex, 1x10, act assist ROM: ankle pumps, quad sets, heel slides, hip abduct/adduct.  Very guarded, tolerating limited ROM due to pain.   Assessment/Plan    PT Assessment Patient needs continued PT services  PT Problem List Decreased strength;Decreased range of motion;Decreased activity tolerance;Decreased balance;Decreased mobility;Decreased coordination;Decreased cognition;Decreased knowledge of use of DME;Decreased safety awareness;Decreased knowledge of precautions;Pain       PT Treatment Interventions DME instruction;Gait training;Stair training;Functional mobility training;Therapeutic activities;Therapeutic exercise;Balance training;Cognitive remediation;Patient/family education    PT Goals (Current goals can be found in the Care Plan section)  Acute Rehab PT Goals Patient Stated Goal: agreeable to  session PT Goal Formulation: With patient Time For Goal Achievement: 06/06/19 Potential to Achieve Goals: Fair    Frequency BID   Barriers to discharge        Co-evaluation               AM-PAC PT "6 Clicks" Mobility  Outcome Measure Help needed turning from your back to your side while in a flat bed without using bedrails?: A Lot Help needed moving from lying on your back to sitting on the side of a flat bed without using bedrails?: A Lot Help needed moving to and from a bed to a chair (including a wheelchair)?: A Lot Help needed standing up from a chair using your arms (e.g., wheelchair or bedside chair)?: Total Help needed to walk in hospital room?: Total Help needed climbing 3-5 steps with a railing? : Total 6 Click Score: 9    End of Session   Activity Tolerance: Patient limited by pain Patient left: in bed;with call bell/phone within reach;with bed alarm set Nurse Communication: Mobility status PT Visit Diagnosis: Difficulty in walking, not elsewhere classified (R26.2);History of falling (Z91.81);Pain Pain - Right/Left: Right Pain - part of body: Hip    Time: 4235-3614 PT Time Calculation (min) (ACUTE ONLY): 24 min   Charges:   PT Evaluation $PT Eval Moderate Complexity: 1 Mod PT Treatments $Therapeutic Exercise: 8-22 mins        Zalika Tieszen H. Owens Hart, PT, DPT, NCS 05/23/19, 9:50 AM [phone]

## 2019-05-23 NOTE — Progress Notes (Signed)
[place] [facility] [place] NAME: Cynthia Hart    MR#:  [id number]  DATE OF BIRTH:  1927/09/07  SUBJECTIVE:   Some mild pain this morning.  Overall no acute issues.  Patient in good spirits.  REVIEW OF SYSTEMS:    Review of Systems  Constitutional: Negative for fever, chills weight loss HENT: Negative for ear pain, nosebleeds, congestion, facial swelling, rhinorrhea, neck pain, neck stiffness and ear discharge.   Respiratory: Negative for cough, shortness of breath, wheezing  Cardiovascular: Negative for chest pain, palpitations and leg swelling.  Gastrointestinal: Negative for heartburn, abdominal pain, vomiting, diarrhea or consitpation Genitourinary: Negative for dysuria, urgency, frequency, hematuria Musculoskeletal: Negative for back pain or joint pain Neurological: Negative for dizziness, seizures, syncope, focal weakness,  numbness and headaches.  Hematological: Does not bruise/bleed easily.  Psychiatric/Behavioral: Negative for hallucinations, confusion, dysphoric mood    Tolerating Diet: yes      DRUG ALLERGIES:   Allergies  Allergen Reactions  . Alendronate Other (See Comments)    From Fosamax-Stomach Pain  . Codeine     Nausea   . Ibandronic Acid Other (See Comments)    From Boniva-Stomach Pain  . Penicillins     Nausea,rash and vomiting     VITALS:  Blood pressure 103/73, pulse 75, temperature 97.9 F (36.6 C), temperature source Oral, resp. rate 16, height 5\' 7"  (1.702 m), weight 56 kg, SpO2 (!) 89 %.  PHYSICAL EXAMINATION:  Constitutional: Appears well-developed and well-nourished. No distress. HENT: Normocephalic. Marland Kitchen Oropharynx is clear and moist.  Eyes: Conjunctivae and EOM are normal. PERRLA, no scleral icterus.  Neck: Normal ROM. Neck supple. No JVD. No tracheal deviation. CVS: RRR, S1/S2 +, no murmurs, no gallops, no carotid bruit.  Pulmonary: Effort and breath sounds normal, no stridor, rhonchi,  wheezes, rales.  Abdominal: Soft. BS +,  no distension, tenderness, rebound or guarding.  Musculoskeletal: Normal range of motion. No edema and no tenderness.  Neuro: Alert. CN 2-12 grossly intact. No focal deficits. Skin: Incision clean, dry and intact without drainage. Psychiatric: Normal mood and affect.      LABORATORY PANEL:   CBC Recent Labs  Lab 05/23/19 0615  WBC 7.1  HGB 7.7*  HCT 24.3*  PLT 149*   ------------------------------------------------------------------------------------------------------------------  Chemistries  Recent Labs  Lab 05/22/19 0021 05/23/19 0615  NA 140 140  K 3.9 4.3  CL 105 110  CO2 26 25  GLUCOSE 151* 131*  BUN 25* 27*  CREATININE 0.94 0.60  CALCIUM 8.9 7.9*  AST 19  --   ALT 13  --   ALKPHOS 72  --   BILITOT 0.3  --    ------------------------------------------------------------------------------------------------------------------  Cardiac Enzymes No results for input(s): TROPONINI in the last 168 hours. ------------------------------------------------------------------------------------------------------------------  RADIOLOGY:  Dg Chest 1 View  Result Date: 05/22/2019 CLINICAL DATA:  85 year old female with right femoral neck fracture. Preop radiograph. EXAM: CHEST  1 VIEW COMPARISON:  None. FINDINGS: Minimal left lung base atelectatic changes. No focal consolidation, pleural effusion, or pneumothorax. Mild cardiomegaly. Atherosclerotic calcification of the aortic arch. The aorta is tortuous. No acute osseous pathology. Osteopenia. IMPRESSION: No active disease. Electronically Signed   By: Anner Crete M.D.   On: 05/22/2019 01:21   Dg Hip Operative Unilat W Or W/o Pelvis Right  Result Date: 05/22/2019 CLINICAL DATA:  Right IM nail EXAM: OPERATIVE right HIP (WITH PELVIS IF PERFORMED) 22 VIEWS TECHNIQUE: Fluoroscopic spot image(s) were submitted for interpretation post-operatively. COMPARISON:  05/22/2019 FINDINGS:  Twenty-two low resolution intraoperative spot views of the right hip. Total fluoroscopy time was 1 minutes 47 seconds. A comminuted intertrochanteric fracture is noted. Subsequent placement of intramedullary rod and distal fixating screw. Displaced lesser trochanteric fracture fragment is noted. IMPRESSION: Intraoperative fluoroscopic assistance provided during surgical fixation of right intertrochanteric fracture Electronically Signed   By: Donavan Foil M.D.   On: 05/22/2019 15:58   Dg Hip Unilat W Or Wo Pelvis 2-3 Views Right  Result Date: 05/22/2019 CLINICAL DATA:  85 year old female with fall and right hip pain. EXAM: DG HIP (WITH OR WITHOUT PELVIS) 2-3V RIGHT COMPARISON:  None. FINDINGS: There is a comminuted, mildly displaced intertrochanteric fracture of the right femur. There is no dislocation. The bones are osteopenic. IMPRESSION: Comminuted, mildly displaced intertrochanteric fracture of the right femur. Electronically Signed   By: Anner Crete M.D.   On: 05/22/2019 01:18     ASSESSMENT AND PLAN:   85 year old female with history of dementia who presents with right hip pain after mechanical fall and found to have right intertrochanteric fracture.  1. Comminuted, mildly displaced intertrochanteric fracture of the right femur: Patient is postoperative day #1 INTRAMEDULLARY (IM) NAIL INTERTROCHANTRIC (Right)  Management as per orthopedic surgery. Hemoglobin 7.7 today and will need to follow-up for tomorrow. PT consultation for discharge planning  2.  Dementia: Continue Aricept 3.  Restless leg syndrome: Continue Requip  4.  Elevated TSH: Check free T4    Management plans discussed with the patient and she is in agreement.  CODE STATUS: FULL  TOTAL TIME TAKING CARE OF THIS PATIENT: 30 minutes.     POSSIBLE D/C tomorrow SNF, DEPENDING ON CLINICAL CONDITION.   Bettey Costa M.D on 05/23/2019 at 9:29 AM  Between 7am to 6pm - Pager - [phone] After 6pm go to  [email or web address] - password [email or web address] [facility]  Office  [phone]  CC: Primary care physician; Nicholos Johns, MD  Note: This dictation was prepared with Dragon dictation along with smaller phrase technology. Any transcriptional errors that result from this process are unintentional.

## 2019-05-23 NOTE — Progress Notes (Signed)
Physical Therapy Treatment Patient Details Name: Cynthia Hart MRN: [id number] DOB: Apr 06, 1927 Today's Date: 05/23/2019    History of Present Illness presented to ER status post mechanical fall with acute R hip pain; admitted with comminuted, displaced introchanteric fracture of R hip, s/p IM nailing (05/22/19), WBAT.    PT Comments    Patient able to initiate OOB this afternoon, requiring heavy mod assist for all movement transitions.  Step by step verbal cuing for task sequencing and initiation, walker management and overall safety.  Additional distance and activity limited by pain; RN informed/aware.    Follow Up Recommendations  SNF     Equipment Recommendations       Recommendations for Other Services       Precautions / Restrictions Precautions Precautions: Fall Restrictions Weight Bearing Restrictions: Yes RLE Weight Bearing: Weight bearing as tolerated    Mobility  Bed Mobility Overal bed mobility: Needs Assistance Bed Mobility: Supine to Sit     Supine to sit: Mod assist;Max assist     General bed mobility comments: extensive assist for position/protection and movement of R LE; hand-over-hand for UE placement and truncal elevation  Transfers Overall transfer level: Needs assistance Equipment used: Rolling walker (2 wheeled) Transfers: Sit to/from Stand Sit to Stand: Mod assist         General transfer comment: prefers UE placement on RW despite cuing; extensive assist for lift off. Min assist for static standing balance once upright.  Ambulation/Gait Ambulation/Gait assistance: Mod assist Gait Distance (Feet): (5x2) Assistive device: Rolling walker (2 wheeled)       General Gait Details: 3-point, step to gait pattern; maintains R LE anterior to BOS due to pain.  step by step cuing for walker management, gait sequencing; constant assist for balance, weight shift. Unable to tolerate additional distance due to pain.   Stairs              Wheelchair Mobility    Modified Rankin (Stroke Patients Only)       Balance Overall balance assessment: Needs assistance Sitting-balance support: No upper extremity supported;Feet supported Sitting balance-Leahy Scale: Fair Sitting balance - Comments: offset to L due to R hip pain Postural control: Left lateral lean Standing balance support: Bilateral upper extremity supported Standing balance-Leahy Scale: Poor                              Cognition Arousal/Alertness: Awake/alert Behavior During Therapy: WFL for tasks assessed/performed Overall Cognitive Status: Within Functional Limits for tasks assessed                                        Exercises Other Exercises Other Exercises: Toilet transfer, SPT with RW, mod assist; sit/stand from [facility] with RW, mod assist; dep assist for hygiene.  Step by step verbal cuing for sequence, technique, walker management    General Comments        Pertinent Vitals/Pain Pain Assessment: Faces Faces Pain Scale: Hurts whole lot Pain Location: R hip Pain Descriptors / Indicators: Aching;Guarding;Grimacing Pain Intervention(s): Limited activity within patient's tolerance;Monitored during session;Patient requesting pain meds-RN notified;Repositioned    Home Living                      Prior Function            PT Goals (current goals can now  be found in the care plan section) Acute Rehab PT Goals Patient Stated Goal: agreeable to session PT Goal Formulation: With patient Time For Goal Achievement: 06/06/19 Potential to Achieve Goals: Fair Progress towards PT goals: Progressing toward goals    Frequency    BID      PT Plan Current plan remains appropriate    Co-evaluation              AM-PAC PT "6 Clicks" Mobility   Outcome Measure  Help needed turning from your back to your side while in a flat bed without using bedrails?: A Lot Help needed moving from lying on your  back to sitting on the side of a flat bed without using bedrails?: A Lot Help needed moving to and from a bed to a chair (including a wheelchair)?: A Lot Help needed standing up from a chair using your arms (e.g., wheelchair or bedside chair)?: A Lot Help needed to walk in hospital room?: A Lot Help needed climbing 3-5 steps with a railing? : Total 6 Click Score: 11    End of Session Equipment Utilized During Treatment: Gait belt Activity Tolerance: Patient limited by pain Patient left: in chair;with chair alarm set;with call bell/phone within reach Nurse Communication: Mobility status PT Visit Diagnosis: Difficulty in walking, not elsewhere classified (R26.2);History of falling (Z91.81);Pain Pain - Right/Left: Right Pain - part of body: Hip     Time: 1355-1426 PT Time Calculation (min) (ACUTE ONLY): 31 min  Charges:  $Therapeutic Activity: 23-37 mins                    Carlo Guevarra H. Owens Shark, PT, DPT, NCS 05/23/19, 2:34 PM [phone]

## 2019-05-24 LAB — CBC
HCT: 24.2 % — ABNORMAL LOW (ref 36.0–46.0)
Hemoglobin: 7.5 g/dL — ABNORMAL LOW (ref 12.0–15.0)
MCH: 31.9 pg (ref 26.0–34.0)
MCHC: 31 g/dL (ref 30.0–36.0)
MCV: 103 fL — ABNORMAL HIGH (ref 80.0–100.0)
Platelets: 157 10*3/uL (ref 150–400)
RBC: 2.35 MIL/uL — ABNORMAL LOW (ref 3.87–5.11)
RDW: 13.8 % (ref 11.5–15.5)
WBC: 8.3 10*3/uL (ref 4.0–10.5)
nRBC: 0 % (ref 0.0–0.2)

## 2019-05-24 LAB — BASIC METABOLIC PANEL
Anion gap: 6 (ref 5–15)
BUN: 22 mg/dL (ref 8–23)
CO2: 24 mmol/L (ref 22–32)
Calcium: 7.9 mg/dL — ABNORMAL LOW (ref 8.9–10.3)
Chloride: 108 mmol/L (ref 98–111)
Creatinine, Ser: 0.5 mg/dL (ref 0.44–1.00)
GFR calc Af Amer: >60 mL/min (ref >60)
GFR calc non Af Amer: >60 mL/min (ref >60)
Glucose, Bld: 110 mg/dL — ABNORMAL HIGH (ref 70–99)
Potassium: 4 mmol/L (ref 3.5–5.1)
Sodium: 138 mmol/L (ref 135–145)

## 2019-05-24 MED ORDER — PANTOPRAZOLE SODIUM 40 MG PO TBEC: 40.0000 mg | DELAYED_RELEASE_TABLET | Freq: Every day | ORAL | 0 refills | Status: DC

## 2019-05-24 MED ORDER — ACETAMINOPHEN 325 MG PO TABS: 650.0000 mg | ORAL_TABLET | Freq: Four times a day (QID) | ORAL | Status: DC | PRN

## 2019-05-24 MED ORDER — ENOXAPARIN SODIUM 40 MG/0.4ML ~~LOC~~ SOLN: 40.0000 mg | SUBCUTANEOUS | 0 refills | Status: DC

## 2019-05-24 NOTE — Progress Notes (Signed)
  Subjective: 2 Days Post-Op Procedure(s) (LRB): INTRAMEDULLARY (IM) NAIL INTERTROCHANTRIC (Right) Patient reports pain as moderate this AM.  Pain in the right heel is much improved this morning. Patient is well, and has had no acute complaints or problems Plan for discharge to SNF when medically appropriate. Negative for chest pain and shortness of breath Fever: 99.5 temp this AM. Gastrointestinal:Negative for nausea and vomiting  Objective: Vital signs in last 24 hours: Temp:  [97.9 F (36.6 C)-99.5 F (37.5 C)] 99.5 F (37.5 C) (06/28 0740) Pulse Rate:  [74-106] 85 (06/28 0740) Resp:  [16-17] 17 (06/28 0740) BP: (103-113)/(54-60) 103/58 (06/28 0740) SpO2:  [91 %-93 %] 93 % (06/28 0740) Weight:  [57.2 kg] 57.2 kg (06/28 0500)  Intake/Output from previous day:  Intake/Output Summary (Last 24 hours) at 05/24/2019 0924 Last data filed at 05/23/2019 2300 Gross per 24 hour  Intake 240 ml  Output 1050 ml  Net -810 ml    Intake/Output this shift: No intake/output data recorded.  Labs: Recent Labs    05/22/19 0021 05/23/19 0615 05/24/19 0645  HGB 12.0 7.7* 7.5*   Recent Labs    05/23/19 0615 05/24/19 0645  WBC 7.1 8.3  RBC 2.42* 2.35*  HCT 24.3* 24.2*  PLT 149* 157   Recent Labs    05/23/19 0615 05/24/19 0645  NA 140 138  K 4.3 4.0  CL 110 108  CO2 25 24  BUN 27* 22  CREATININE 0.60 0.50  GLUCOSE 131* 110*  CALCIUM 7.9* 7.9*   Recent Labs    05/22/19 0021  INR 1.0     EXAM General - Patient is Alert, Appropriate and Oriented Extremity - ABD soft Neurovascular intact Sensation intact distally Intact pulses distally Dorsiflexion/Plantar flexion intact Incision: dressing C/D/I No cellulitis present  Dressing/Incision - clean, dry, no drainage Motor Function - intact, moving foot and toes well on exam.  Abdomen soft with normal bowel sounds.  Past Medical History:  Diagnosis Date  . Diabetes ([place])   . Hypertension   . Osteoarthritis   .  Osteoporosis   . Postmenopausal   . Tumor   . Vitamin D deficiency    Assessment/Plan: 2 Days Post-Op Procedure(s) (LRB): INTRAMEDULLARY (IM) NAIL INTERTROCHANTRIC (Right) Active Problems:   Hip fracture ([facility])  Estimated body mass index is 19.73 kg/m as calculated from the following:   Height as of this encounter: 5\' 7"  (1.702 m).   Weight as of this encounter: 57.2 kg. Advance diet Up with therapy D/C IV fluids when tolerating po intake.  Labs reviewed this AM, Hg 7.5, BP 103/58 this AM.  Continue with IVF at this time. Patient is passing gas without pain this morning. Discharge to SNF when appropriate.  When discharged from the hospital continue Lovenox 40mg  daily for 14 days. Staples can be removed by SNF on 06/04/19. Follow-up with [place] in 6 weeks.  DVT Prophylaxis - Lovenox, Foot Pumps and TED hose Weight-Bearing as tolerated to right leg  J. Cameron Proud, PA-C [facility] Orthopaedic Surgery 05/24/2019, 9:24 AM

## 2019-05-24 NOTE — Progress Notes (Signed)
Physical Therapy Treatment Patient Details Name: Cynthia Hart MRN: [id number] DOB: 07-Sep-1927 Today's Date: 05/24/2019    History of Present Illness presented to ER status post mechanical fall with acute R hip pain; admitted with comminuted, displaced introchanteric fracture of R hip, s/p IM nailing (05/22/19), WBAT.    PT Comments    Pt agreeable to PT; reports 6/10 pain along entire R side. Pt responds to bed exercise treatment with greater pain (tearful, grimace, crying out) with movement of RLE with primary c/o R knee pain. Pt requires assist and rest periods between exercises. Pt demonstrates limited range in RLE hip and knee joints and increased pain despite gentle, supported assisted movement. Pt's daughter called during session; information taken and provided to SW via team sticky note for daughter to be updated regarding pt discharge. Deferred OOB treatment today per pt wish post response to supine exercise session. Encouraged isometric exercises and AP throughout the day. Continue PT to progress participation, exercise/activity tolerance, range, strength to improve functional mobility.    Follow Up Recommendations  SNF     Equipment Recommendations       Recommendations for Other Services       Precautions / Restrictions Precautions Precautions: Fall Restrictions Weight Bearing Restrictions: Yes RLE Weight Bearing: Weight bearing as tolerated    Mobility  Bed Mobility               General bed mobility comments: Not tested, due to pain/demeanor with exercises; pt refusal  Transfers                    Ambulation/Gait                 Stairs             Wheelchair Mobility    Modified Rankin (Stroke Patients Only)       Balance                                            Cognition Arousal/Alertness: Awake/alert Behavior During Therapy: WFL for tasks assessed/performed;Anxious Overall Cognitive Status:  Within Functional Limits for tasks assessed                                        Exercises General Exercises - Lower Extremity Ankle Circles/Pumps: AROM;Both;20 reps Quad Sets: Strengthening;Both;20 reps Gluteal Sets: Strengthening;Both;20 reps Short Arc QuadSinclair Ship;Right;20 reps;Other (comment)(AROM L) Heel Slides: AAROM;Right;10 reps;Other (comment)(add'l set of 5 (crying out with pain), limited range; AROM L) Hip ABduction/ADduction: AAROM;Right;20 reps;Supine;Other (comment)(limited range, crying out with pain; AAROM L) Straight Leg Raises: AAROM;Left;20 reps;Supine Other Exercises Other Exercises: Education/encouraged continued work on AP, QS, GS and active movement of LLE Other Exercises: Spoke with daughter to relay name and phone numbe to SW for pt information regarding discharge    General Comments        Pertinent Vitals/Pain Pain Assessment: 0-10 Pain Score: 6 (with AAROM, pt tearful and crying out in pain) Pain Location: R side (shoulder to foot, per pt). Mostly complains of knee Pain Descriptors / Indicators: Constant;Crying;Grimacing;Other (Comment);Guarding(crying out )    Home Living                      Prior Function  PT Goals (current goals can now be found in the care plan section) Progress towards PT goals: Progressing toward goals(slowly)    Frequency    BID      PT Plan Current plan remains appropriate    Co-evaluation              AM-PAC PT "6 Clicks" Mobility   Outcome Measure  Help needed turning from your back to your side while in a flat bed without using bedrails?: A Lot Help needed moving from lying on your back to sitting on the side of a flat bed without using bedrails?: A Lot Help needed moving to and from a bed to a chair (including a wheelchair)?: A Lot Help needed standing up from a chair using your arms (e.g., wheelchair or bedside chair)?: A Lot Help needed to walk in hospital  room?: A Lot Help needed climbing 3-5 steps with a railing? : Total 6 Click Score: 11    End of Session   Activity Tolerance: Patient limited by pain Patient left: in bed;with call bell/phone within reach;with bed alarm set;Other (comment)(bed lowered to ground per nursing)   PT Visit Diagnosis: Difficulty in walking, not elsewhere classified (R26.2);History of falling (Z91.81);Pain Pain - Right/Left: Right Pain - part of body: Hip     Time: 3428-7681 PT Time Calculation (min) (ACUTE ONLY): 31 min  Charges:  $Therapeutic Exercise: 23-37 mins                      Larae Grooms, PTA 05/24/2019, 11:36 AM

## 2019-05-24 NOTE — Discharge Summary (Addendum)
[facility] NAME: Cynthia Hart    MR#:  [id number]  DATE OF BIRTH:  [birth year]  DATE OF ADMISSION:  05/22/2019 ADMITTING PHYSICIAN: Harrie Foreman, MD  DATE OF DISCHARGE: 05/25/2019  PRIMARY CARE PHYSICIAN: Nicholos Johns, MD   ADMISSION DIAGNOSIS:  Hypoxemia [R09.02] Fall [W19.XXXA] Closed displaced intertrochanteric fracture of right femur, initial encounter ([place]) [S72.141A]  DISCHARGE DIAGNOSIS:  Active Problems:   Hip fracture ([place])  SECONDARY DIAGNOSIS:   Past Medical History:  Diagnosis Date  . Diabetes ([place])   . Hypertension   . Osteoarthritis   . Osteoporosis   . Postmenopausal   . Tumor   . Vitamin D deficiency     HOSPITAL COURSE:  85 year old female with history of dementia who presents with right hip pain after mechanical fall and found to have right intertrochanteric fracture.  1. Comminuted, mildly displaced intertrochanteric fracture of the right femur: Patient is postoperative day #3 INTRAMEDULLARY (IM) NAIL INTERTROCHANTRIC (Right)  Physical therapy has recommended skilled nursing facility upon discharge.  She will continue Lovenox 40 mg daily for 14 days to prevent DVTs.  Jodell Cipro will be removed by skilled nursing facility on July 9.  Patient will follow up with [facility] orthopedics in 6 weeks. Weightbearing as tolerated to right leg.   Protonix can be stopped after Lovenox has been discontinued.  2.  Dementia: Continue Aricept 3.  Restless leg syndrome: Continue Requip  4.  Elevated TSH with normal T4 5.  Acute on chronic anemia postop: Patient has stable hemoglobin at discharge.  She will need follow-up CBC in 1 to 2 days.  DISCHARGE CONDITIONS AND DIET:   Stable for discharge regular diet  CONSULTS OBTAINED:  Treatment Team:  Hessie Knows, MD  DRUG ALLERGIES:   Allergies  Allergen Reactions  . Alendronate Other (See Comments)    From Fosamax-Stomach Pain  . Codeine      Nausea   . Ibandronic Acid Other (See Comments)    From Boniva-Stomach Pain  . Penicillins     Nausea,rash and vomiting     DISCHARGE MEDICATIONS:   Allergies as of 05/24/2019      Reactions   Alendronate Other (See Comments)   From Fosamax-Stomach Pain   Codeine    Nausea    Ibandronic Acid Other (See Comments)   From Boniva-Stomach Pain   Penicillins    Nausea,rash and vomiting      Medication List    TAKE these medications   acetaminophen 325 MG tablet Commonly known as: TYLENOL Take 2 tablets (650 mg total) by mouth every 6 (six) hours as needed for mild pain (or Fever >/= 101). What changed: reasons to take this   donepezil 10 MG tablet Commonly known as: ARICEPT Take 10 mg by mouth at bedtime.   enoxaparin 40 MG/0.4ML injection Commonly known as: LOVENOX Inject 0.4 mLs (40 mg total) into the skin daily.   fluticasone 50 MCG/ACT nasal spray Commonly known as: FLONASE Place 1 spray into both nostrils daily.   pantoprazole 40 MG tablet Commonly known as: Protonix Take 1 tablet (40 mg total) by mouth daily.   rOPINIRole 0.25 MG tablet Commonly known as: REQUIP Take 1 tablet by mouth at bedtime.   Vitamin D3 1.25 MG (50000 UT) Caps Take 1 capsule by mouth every 30 (thirty) days.         Today   CHIEF COMPLAINT:  No acute events reported overnight.  Patient without pain this morning.  VITAL SIGNS:  Blood pressure (!) 103/58, pulse 85, temperature 99.5 F (37.5 C), temperature source Oral, resp. rate 17, height 5\' 7"  (1.702 m), weight 57.2 kg, SpO2 93 %.   REVIEW OF SYSTEMS:  Review of Systems  Constitutional: Negative.  Negative for chills, fever and malaise/fatigue.  HENT: Negative.  Negative for ear discharge, ear pain, hearing loss, nosebleeds and sore throat.   Eyes: Negative.  Negative for blurred vision and pain.  Respiratory: Negative.  Negative for cough, hemoptysis, shortness of breath and wheezing.   Cardiovascular: Negative.   Negative for chest pain, palpitations and leg swelling.  Gastrointestinal: Negative.  Negative for abdominal pain, blood in stool, diarrhea, nausea and vomiting.  Genitourinary: Negative.  Negative for dysuria.  Musculoskeletal: Negative.  Negative for back pain.  Skin: Negative.   Neurological: Negative for dizziness, tremors, speech change, focal weakness, seizures and headaches.  Endo/Heme/Allergies: Negative.  Does not bruise/bleed easily.  Psychiatric/Behavioral: Positive for memory loss. Negative for depression, hallucinations and suicidal ideas.     PHYSICAL EXAMINATION:  GENERAL:  85 y.o.-year-old patient lying in the bed with no acute distress.  NECK:  Supple, no jugular venous distention. No thyroid enlargement, no tenderness.  LUNGS: Normal breath sounds bilaterally, no wheezing, rales,rhonchi  No use of accessory muscles of respiration.  CARDIOVASCULAR: S1, S2 normal. No murmurs, rubs, or gallops.  ABDOMEN: Soft, non-tender, non-distended. Bowel sounds present. No organomegaly or mass.  EXTREMITIES: No pedal edema, cyanosis, or clubbing.  PSYCHIATRIC: The patient is alert and oriented x name place not time.  SKIN: No obvious rash, lesion, or ulcer.  Clean, dry no drainage from incision site DATA REVIEW:   CBC Recent Labs  Lab 05/24/19 0645  WBC 8.3  HGB 7.5*  HCT 24.2*  PLT 157    Chemistries  Recent Labs  Lab 05/22/19 0021  05/24/19 0645  NA 140   < > 138  K 3.9   < > 4.0  CL 105   < > 108  CO2 26   < > 24  GLUCOSE 151*   < > 110*  BUN 25*   < > 22  CREATININE 0.94   < > 0.50  CALCIUM 8.9   < > 7.9*  AST 19  --   --   ALT 13  --   --   ALKPHOS 72  --   --   BILITOT 0.3  --   --    < > = values in this interval not displayed.    Cardiac Enzymes No results for input(s): TROPONINI in the last 168 hours.  Microbiology Results  @MICRORSLT48 @  RADIOLOGY:  Dg Hip Operative Unilat W Or W/o Pelvis Right  Result Date: 05/22/2019 CLINICAL DATA:  Right  IM nail EXAM: OPERATIVE right HIP (WITH PELVIS IF PERFORMED) 22 VIEWS TECHNIQUE: Fluoroscopic spot image(s) were submitted for interpretation post-operatively. COMPARISON:  05/22/2019 FINDINGS: Twenty-two low resolution intraoperative spot views of the right hip. Total fluoroscopy time was 1 minutes 47 seconds. A comminuted intertrochanteric fracture is noted. Subsequent placement of intramedullary rod and distal fixating screw. Displaced lesser trochanteric fracture fragment is noted. IMPRESSION: Intraoperative fluoroscopic assistance provided during surgical fixation of right intertrochanteric fracture Electronically Signed   By: Donavan Foil M.D.   On: 05/22/2019 15:58      Allergies as of 05/24/2019      Reactions   Alendronate Other (See Comments)   From Fosamax-Stomach Pain   Codeine    Nausea    Ibandronic Acid Other (  See Comments)   From Boniva-Stomach Pain   Penicillins    Nausea,rash and vomiting      Medication List    TAKE these medications   acetaminophen 325 MG tablet Commonly known as: TYLENOL Take 2 tablets (650 mg total) by mouth every 6 (six) hours as needed for mild pain (or Fever >/= 101). What changed: reasons to take this   donepezil 10 MG tablet Commonly known as: ARICEPT Take 10 mg by mouth at bedtime.   enoxaparin 40 MG/0.4ML injection Commonly known as: LOVENOX Inject 0.4 mLs (40 mg total) into the skin daily.   fluticasone 50 MCG/ACT nasal spray Commonly known as: FLONASE Place 1 spray into both nostrils daily.   pantoprazole 40 MG tablet Commonly known as: Protonix Take 1 tablet (40 mg total) by mouth daily.   rOPINIRole 0.25 MG tablet Commonly known as: REQUIP Take 1 tablet by mouth at bedtime.   Vitamin D3 1.25 MG (50000 UT) Caps Take 1 capsule by mouth every 30 (thirty) days.          Management plans discussed with the patient and she is in agreement. Stable for discharge snf  Patient should follow up with ortho  CODE STATUS:      Code Status Orders  (From admission, onward)         Start     Ordered   05/22/19 0318  Full code  Continuous     05/22/19 0317        Code Status History    This patient has a current code status but no historical code status.   Advance Care Planning Activity      TOTAL TIME TAKING CARE OF THIS PATIENT: 38 minutes.    Note: This dictation was prepared with Dragon dictation along with smaller phrase technology. Any transcriptional errors that result from this process are unintentional.  Neita Carp M.D on 05/25/2019 at 12:23 PM  Between 7am to 6pm - Pager - [phone]  After 6pm go to [email or web address] - password [email or web address] [facility]  Office  [phone]  CC: Primary care physician; Nicholos Johns, MD

## 2019-05-25 ENCOUNTER — Encounter: Payer: Self-pay | Admitting: Orthopedic Surgery

## 2019-05-25 NOTE — Care Management Important Message (Signed)
Important Message  Patient Details  Name: Cynthia Hart MRN: [id number] Date of Birth: 02/03/1927   Medicare Important Message Given:  Yes     Dannette Barbara 05/25/2019, 11:14 AM

## 2019-05-25 NOTE — Progress Notes (Signed)
Report called and given to [place] at [facility]. IV removed. Stable condition. EMS called for transport.

## 2019-05-25 NOTE — Progress Notes (Signed)
Subjective: 3 Days Post-Op Procedure(s) (LRB): INTRAMEDULLARY (IM) NAIL INTERTROCHANTRIC (Right) Patient reports pain as mild-moderate, improved when compared to yesterday. Patient is well, and has had no acute complaints or problems Plan for discharge to SNF likely today. Negative for chest pain and shortness of breath Fever: No recent fevers. Gastrointestinal:Negative for nausea and vomiting  Objective: Vital signs in last 24 hours: Temp:  [97.9 F (36.6 C)-98.6 F (37 C)] 97.9 F (36.6 C) (06/29 0750) Pulse Rate:  [74-80] 74 (06/29 0750) Resp:  [16-17] 17 (06/29 0750) BP: (105-119)/(46-59) 105/55 (06/29 0750) SpO2:  [90 %-95 %] 93 % (06/29 0750) Weight:  [57.2 kg] 57.2 kg (06/29 0654)  Intake/Output from previous day:  Intake/Output Summary (Last 24 hours) at 05/25/2019 0804 Last data filed at 05/25/2019 0539 Gross per 24 hour  Intake -  Output 200 ml  Net -200 ml    Intake/Output this shift: No intake/output data recorded.  Labs: Recent Labs    05/23/19 0615 05/24/19 0645  HGB 7.7* 7.5*   Recent Labs    05/23/19 0615 05/24/19 0645  WBC 7.1 8.3  RBC 2.42* 2.35*  HCT 24.3* 24.2*  PLT 149* 157   Recent Labs    05/23/19 0615 05/24/19 0645  NA 140 138  K 4.3 4.0  CL 110 108  CO2 25 24  BUN 27* 22  CREATININE 0.60 0.50  GLUCOSE 131* 110*  CALCIUM 7.9* 7.9*   No results for input(s): LABPT, INR in the last 72 hours.   EXAM General - Patient is Alert, Appropriate and Oriented Extremity - ABD soft Neurovascular intact Sensation intact distally Intact pulses distally Dorsiflexion/Plantar flexion intact Incision: dressing C/D/I No cellulitis present  Dressing/Incision - clean, dry, no drainage Motor Function - intact, moving foot and toes well on exam.  Abdomen soft with normal bowel sounds.  Past Medical History:  Diagnosis Date  . Diabetes ([place])   . Hypertension   . Osteoarthritis   . Osteoporosis   . Postmenopausal   . Tumor   . Vitamin  D deficiency    Assessment/Plan: 3 Days Post-Op Procedure(s) (LRB): INTRAMEDULLARY (IM) NAIL INTERTROCHANTRIC (Right) Active Problems:   Hip fracture ([facility])  Estimated body mass index is 19.74 kg/m as calculated from the following:   Height as of this encounter: 5\' 7"  (1.702 m).   Weight as of this encounter: 57.2 kg. Up with therapy   CBC has been ordered for this morning. Patient is passing gas without pain this morning, patient has had a BM. Discharge to SNF when appropriate, possibly today.  When discharged from the hospital continue Lovenox 40mg  daily for 14 days. Staples can be removed by SNF on 06/04/19. Follow-up with [facility] in 6 weeks.  DVT Prophylaxis - Lovenox, Foot Pumps and TED hose Weight-Bearing as tolerated to right leg  J. Cameron Proud, PA-C [facility] Orthopaedic Surgery 05/25/2019, 8:04 AM

## 2019-05-25 NOTE — Progress Notes (Signed)
Physical Therapy Treatment Patient Details Name: Cynthia Hart MRN: [id number] DOB: [birth year] Today's Date: 05/25/2019    History of Present Illness presented to ER status post mechanical fall with acute R hip pain; admitted with comminuted, displaced introchanteric fracture of R hip, s/p IM nailing (05/22/19), WBAT.    PT Comments    Remains generally guarded to all movement of R hip, requiring act assist for movement in all planes.  Did agree to and participate with OOB to chair, though requiring increase in assist this date (compared to previous attempt) with mod L post/lateral lean throughout due to R hip pain.  Extensive assist for walker management, weight shift and overall balance.    Follow Up Recommendations  SNF     Equipment Recommendations       Recommendations for Other Services       Precautions / Restrictions Precautions Precautions: Fall Restrictions Weight Bearing Restrictions: Yes RLE Weight Bearing: Weight bearing as tolerated    Mobility  Bed Mobility Overal bed mobility: Needs Assistance Bed Mobility: Supine to Sit     Supine to sit: Mod assist;Max assist     General bed mobility comments: assist for R LE position/protection, hand-over-hand for UE placement and truncal elevation  Transfers Overall transfer level: Needs assistance Equipment used: Rolling walker (2 wheeled) Transfers: Sit to/from Stand Sit to Stand: Mod assist;Max assist         General transfer comment: prefers UE placement on RW despite cuing; extensive assist for lift off. Mod assist for static standing balance once upright, L lateral lean to offset R LE  Ambulation/Gait Ambulation/Gait assistance: Max assist;Mod assist Gait Distance (Feet): 5 Feet Assistive device: Rolling walker (2 wheeled)       General Gait Details: 3-point, shuffling gait pattern with heavy L/posterior lean; mod/max assist from thearpist for walker management, weight shift and overall guide to  movement.   Stairs             Wheelchair Mobility    Modified Rankin (Stroke Patients Only)       Balance Overall balance assessment: Needs assistance Sitting-balance support: No upper extremity supported;Feet supported Sitting balance-Leahy Scale: Fair Sitting balance - Comments: offset to L due to R hip pain   Standing balance support: Bilateral upper extremity supported Standing balance-Leahy Scale: Poor                              Cognition   Behavior During Therapy: WFL for tasks assessed/performed Overall Cognitive Status: Within Functional Limits for tasks assessed                                        Exercises Other Exercises Other Exercises: Supine L LE therex, 1x10, act assist ROM: ankle pumps, quad sets, SAQs, heel slides, hip abduct/adduct. Slight improvement in active movement; remains globally guarded due to pain.    General Comments        Pertinent Vitals/Pain Pain Assessment: Faces Faces Pain Scale: Hurts whole lot Pain Location: R hip/LE Pain Descriptors / Indicators: Aching;Grimacing;Guarding Pain Intervention(s): Limited activity within patient's tolerance;Monitored during session;Premedicated before session;Repositioned    Home Living                      Prior Function            PT Goals (current  goals can now be found in the care plan section) Acute Rehab PT Goals Patient Stated Goal: agreeable to session PT Goal Formulation: With patient Time For Goal Achievement: 06/06/19 Potential to Achieve Goals: Fair Progress towards PT goals: Progressing toward goals    Frequency    BID      PT Plan Current plan remains appropriate    Co-evaluation              AM-PAC PT "6 Clicks" Mobility   Outcome Measure  Help needed turning from your back to your side while in a flat bed without using bedrails?: A Lot Help needed moving from lying on your back to sitting on the side of a  flat bed without using bedrails?: A Lot Help needed moving to and from a bed to a chair (including a wheelchair)?: A Lot Help needed standing up from a chair using your arms (e.g., wheelchair or bedside chair)?: A Lot Help needed to walk in hospital room?: A Lot Help needed climbing 3-5 steps with a railing? : Total 6 Click Score: 11    End of Session Equipment Utilized During Treatment: Gait belt Activity Tolerance: Patient limited by pain Patient left: in chair;with call bell/phone within reach;with chair alarm set Nurse Communication: Mobility status PT Visit Diagnosis: Difficulty in walking, not elsewhere classified (R26.2);History of falling (Z91.81);Pain Pain - Right/Left: Right Pain - part of body: Hip     Time: 1610-9604 PT Time Calculation (min) (ACUTE ONLY): 23 min  Charges:  $Therapeutic Exercise: 8-22 mins $Therapeutic Activity: 8-22 mins                     Eliabeth Shoff H. Owens Shark, PT, DPT, NCS 05/25/19, 11:05 AM [phone]

## 2019-05-25 NOTE — TOC Progression Note (Signed)
Transition of Care [facility]) - Progression Note    Patient Details  Name: Cynthia Hart MRN: [id number] Date of Birth: 04-Aug-1927  Transition of Care [facility]) CM/SW [place], RN Phone Number: 05/25/2019, 10:07 AM  Clinical Narrative:     Damaris Schooner to Claiborne Billings at [facility], they have requested Auth and anticipate having it today,  If she DCs today then she will not need a new Covid test if not she will, Notified the physician  Expected Discharge Plan: [place] Barriers to Discharge: Continued Medical Work up  Expected Discharge Plan and Services Expected Discharge Plan: [place] In-house Referral: Clinical Social Work Discharge Planning Services: CM Consult Post Acute Care Choice: [place] Living arrangements for the past 2 months: [place] Expected Discharge Date: 05/24/19                                     Social Determinants of Health (SDOH) Interventions    Readmission Risk Interventions No flowsheet data found.

## 2019-05-25 NOTE — Progress Notes (Signed)
PT Cancellation Note  Patient Details Name: Cynthia Hart MRN: [id number] DOB: 1927/01/18   Cancelled Treatment:    Reason Eval/Treat Not Completed: Pain limiting ability to participate(Treatment session re-attempted. Patient just receiving pain medication.  Will re-attempt at later time/date as medications take effect.)   Myrl Bynum H. Owens Shark, PT, DPT, NCS 05/25/19, 9:52 AM [phone]

## 2019-05-25 NOTE — TOC Transition Note (Signed)
Transition of Care [facility]) - CM/SW Discharge Note   Patient Details  Name: Torina Ey MRN: [id number] Date of Birth: 10/22/1927  [facility]) CM/SW Contact:  Su Hilt, RN Phone Number: 05/25/2019, 12:34 PM   Clinical Narrative:    Patient to discharge to [facility] room 29 B, Nurse to call report to [phone] Nurse to call EMS, Daughter made aware of the DC and room number She stated that she brought a suitcase with clothing and shoes to the medical mall and it is in the patients room. DC packet on the chart     Barriers to Discharge: Continued Medical Work up   Patient Goals and CMS Choice Patient states their goals for this hospitalization and ongoing recovery are:: Pain control      Discharge Placement                       Discharge Plan and Services In-house Referral: Clinical Social Work Discharge Planning Services: CM Consult Post Acute Care Choice: [place]                               Social Determinants of Health (SDOH) Interventions     Readmission Risk Interventions No flowsheet data found.

## 2019-05-30 ENCOUNTER — Encounter: Payer: Self-pay | Admitting: Emergency Medicine

## 2019-05-30 ENCOUNTER — Emergency Department: Payer: Medicare Other

## 2019-05-30 ENCOUNTER — Inpatient Hospital Stay
Admission: EM | Admit: 2019-05-30 | Discharge: 2019-06-02 | DRG: 388 | Disposition: A | Discharge status: Skilled Nursing Facility | Payer: Medicare Other | Source: Skilled Nursing Facility | Attending: Internal Medicine | Admitting: Internal Medicine

## 2019-05-30 ENCOUNTER — Other Ambulatory Visit: Payer: Self-pay

## 2019-05-30 DIAGNOSIS — E876 Hypokalemia: Secondary | ICD-10-CM | POA: Diagnosis not present

## 2019-05-30 DIAGNOSIS — K567 Ileus, unspecified: Secondary | ICD-10-CM | POA: Diagnosis present

## 2019-05-30 DIAGNOSIS — E119 Type 2 diabetes mellitus without complications: Secondary | ICD-10-CM | POA: Diagnosis present

## 2019-05-30 DIAGNOSIS — R109 Unspecified abdominal pain: Secondary | ICD-10-CM | POA: Diagnosis not present

## 2019-05-30 DIAGNOSIS — Z888 Allergy status to other drugs, medicaments and biological substances status: Secondary | ICD-10-CM

## 2019-05-30 DIAGNOSIS — G2581 Restless legs syndrome: Secondary | ICD-10-CM | POA: Diagnosis present

## 2019-05-30 DIAGNOSIS — F0390 Unspecified dementia without behavioral disturbance: Secondary | ICD-10-CM | POA: Diagnosis present

## 2019-05-30 DIAGNOSIS — I10 Essential (primary) hypertension: Secondary | ICD-10-CM | POA: Diagnosis present

## 2019-05-30 DIAGNOSIS — Z7951 Long term (current) use of inhaled steroids: Secondary | ICD-10-CM

## 2019-05-30 DIAGNOSIS — Z9049 Acquired absence of other specified parts of digestive tract: Secondary | ICD-10-CM

## 2019-05-30 DIAGNOSIS — E43 Unspecified severe protein-calorie malnutrition: Secondary | ICD-10-CM | POA: Diagnosis present

## 2019-05-30 DIAGNOSIS — Z880 Allergy status to penicillin: Secondary | ICD-10-CM

## 2019-05-30 DIAGNOSIS — D649 Anemia, unspecified: Secondary | ICD-10-CM | POA: Diagnosis not present

## 2019-05-30 DIAGNOSIS — Z79891 Long term (current) use of opiate analgesic: Secondary | ICD-10-CM

## 2019-05-30 DIAGNOSIS — Z8249 Family history of ischemic heart disease and other diseases of the circulatory system: Secondary | ICD-10-CM

## 2019-05-30 DIAGNOSIS — Z1159 Encounter for screening for other viral diseases: Secondary | ICD-10-CM

## 2019-05-30 DIAGNOSIS — Z803 Family history of malignant neoplasm of breast: Secondary | ICD-10-CM

## 2019-05-30 DIAGNOSIS — Z885 Allergy status to narcotic agent status: Secondary | ICD-10-CM

## 2019-05-30 DIAGNOSIS — Z79899 Other long term (current) drug therapy: Secondary | ICD-10-CM

## 2019-05-30 DIAGNOSIS — R339 Retention of urine, unspecified: Secondary | ICD-10-CM | POA: Diagnosis present

## 2019-05-30 DIAGNOSIS — M810 Age-related osteoporosis without current pathological fracture: Secondary | ICD-10-CM | POA: Diagnosis present

## 2019-05-30 DIAGNOSIS — K219 Gastro-esophageal reflux disease without esophagitis: Secondary | ICD-10-CM | POA: Diagnosis present

## 2019-05-30 LAB — COMPREHENSIVE METABOLIC PANEL
ALT: 24 U/L (ref 0–44)
AST: 30 U/L (ref 15–41)
Albumin: 2.9 g/dL — ABNORMAL LOW (ref 3.5–5.0)
Alkaline Phosphatase: 69 U/L (ref 38–126)
Anion gap: 10 (ref 5–15)
BUN: 22 mg/dL (ref 8–23)
CO2: 24 mmol/L (ref 22–32)
Calcium: 8.6 mg/dL — ABNORMAL LOW (ref 8.9–10.3)
Chloride: 96 mmol/L — ABNORMAL LOW (ref 98–111)
Creatinine, Ser: 0.46 mg/dL (ref 0.44–1.00)
GFR calc Af Amer: >60 mL/min (ref >60)
GFR calc non Af Amer: >60 mL/min (ref >60)
Glucose, Bld: 108 mg/dL — ABNORMAL HIGH (ref 70–99)
Potassium: 3.8 mmol/L (ref 3.5–5.1)
Sodium: 130 mmol/L — ABNORMAL LOW (ref 135–145)
Total Bilirubin: 1.2 mg/dL (ref 0.3–1.2)
Total Protein: 6.4 g/dL — ABNORMAL LOW (ref 6.5–8.1)

## 2019-05-30 LAB — CBC WITH DIFFERENTIAL/PLATELET
Abs Immature Granulocytes: 0.09 10*3/uL — ABNORMAL HIGH (ref 0.00–0.07)
Basophils Absolute: 0 10*3/uL (ref 0.0–0.1)
Basophils Relative: 0 %
Eosinophils Absolute: 0.1 10*3/uL (ref 0.0–0.5)
Eosinophils Relative: 1 %
HCT: 24.4 % — ABNORMAL LOW (ref 36.0–46.0)
Hemoglobin: 7.7 g/dL — ABNORMAL LOW (ref 12.0–15.0)
Immature Granulocytes: 1 %
Lymphocytes Relative: 13 %
Lymphs Abs: 1.2 10*3/uL (ref 0.7–4.0)
MCH: 32.5 pg (ref 26.0–34.0)
MCHC: 31.6 g/dL (ref 30.0–36.0)
MCV: 103 fL — ABNORMAL HIGH (ref 80.0–100.0)
Monocytes Absolute: 1.6 10*3/uL — ABNORMAL HIGH (ref 0.1–1.0)
Monocytes Relative: 17 %
Neutro Abs: 6.2 10*3/uL (ref 1.7–7.7)
Neutrophils Relative %: 68 %
Platelets: 461 10*3/uL — ABNORMAL HIGH (ref 150–400)
RBC: 2.37 MIL/uL — ABNORMAL LOW (ref 3.87–5.11)
RDW: 15.4 % (ref 11.5–15.5)
WBC: 9.1 10*3/uL (ref 4.0–10.5)
nRBC: 0 % (ref 0.0–0.2)

## 2019-05-30 LAB — URINALYSIS, COMPLETE (UACMP) WITH MICROSCOPIC
Bacteria, UA: NONE SEEN
Bilirubin Urine: NEGATIVE
Glucose, UA: NEGATIVE mg/dL
Hgb urine dipstick: NEGATIVE
Ketones, ur: NEGATIVE mg/dL
Leukocytes,Ua: NEGATIVE
Nitrite: NEGATIVE
Protein, ur: NEGATIVE mg/dL
Specific Gravity, Urine: 1.016 (ref 1.005–1.030)
Squamous Epithelial / HPF: NONE SEEN (ref 0–5)
pH: 6 (ref 5.0–8.0)

## 2019-05-30 LAB — LIPASE, BLOOD: Lipase: 84 U/L — ABNORMAL HIGH (ref 11–51)

## 2019-05-30 LAB — SARS CORONAVIRUS 2 BY RT PCR (HOSPITAL ORDER, PERFORMED IN ~~LOC~~ HOSPITAL LAB): SARS Coronavirus 2: NEGATIVE

## 2019-05-30 MED ORDER — ROPINIROLE HCL 0.25 MG PO TABS
0.2500 mg | ORAL_TABLET | Freq: Every day | ORAL | Status: DC
  Administered 2019-05-31 – 2019-06-01 (×2): 0.25 mg via ORAL
  Filled 2019-05-30 (×4): qty 1

## 2019-05-30 MED ORDER — FLUTICASONE PROPIONATE 50 MCG/ACT NA SUSP
1.0000 | Freq: Every day | NASAL | Status: DC
  Administered 2019-06-01: 1 via NASAL
  Filled 2019-05-30 (×2): qty 16

## 2019-05-30 MED ORDER — IOHEXOL 300 MG/ML  SOLN
100.0000 mL | Freq: Once | INTRAMUSCULAR | Status: AC | PRN
  Administered 2019-05-30: 75 mL via INTRAVENOUS

## 2019-05-30 MED ORDER — LACTULOSE 10 GM/15ML PO SOLN: 30.0000 g | Freq: Two times a day (BID) | ORAL | Status: DC | PRN

## 2019-05-30 MED ORDER — ENOXAPARIN SODIUM 40 MG/0.4ML ~~LOC~~ SOLN
40.0000 mg | SUBCUTANEOUS | Status: DC
  Administered 2019-05-31: 40 mg via SUBCUTANEOUS
  Filled 2019-05-30: qty 0.4

## 2019-05-30 MED ORDER — TAMSULOSIN HCL 0.4 MG PO CAPS
0.4000 mg | ORAL_CAPSULE | Freq: Every day | ORAL | Status: DC
  Administered 2019-05-30 – 2019-06-02 (×4): 0.4 mg via ORAL
  Filled 2019-05-30 (×4): qty 1

## 2019-05-30 MED ORDER — ACETAMINOPHEN 650 MG RE SUPP: 650.0000 mg | Freq: Four times a day (QID) | RECTAL | Status: DC | PRN

## 2019-05-30 MED ORDER — ONDANSETRON HCL 4 MG PO TABS: 4.0000 mg | ORAL_TABLET | Freq: Four times a day (QID) | ORAL | Status: DC | PRN

## 2019-05-30 MED ORDER — POLYETHYLENE GLYCOL 3350 17 G PO PACK
17.0000 g | PACK | Freq: Every day | ORAL | Status: DC
  Administered 2019-05-30 – 2019-06-01 (×2): 17 g via ORAL
  Filled 2019-05-30 (×2): qty 1

## 2019-05-30 MED ORDER — ONDANSETRON HCL 4 MG/2ML IJ SOLN: 4.0000 mg | Freq: Four times a day (QID) | INTRAMUSCULAR | Status: DC | PRN

## 2019-05-30 MED ORDER — DONEPEZIL HCL 5 MG PO TABS
10.0000 mg | ORAL_TABLET | Freq: Every day | ORAL | Status: DC
  Administered 2019-05-30 – 2019-06-01 (×3): 10 mg via ORAL
  Filled 2019-05-30 (×3): qty 2

## 2019-05-30 MED ORDER — IOHEXOL 240 MG/ML SOLN
50.0000 mL | Freq: Once | INTRAMUSCULAR | Status: AC | PRN
  Administered 2019-05-30: 50 mL via ORAL

## 2019-05-30 MED ORDER — ACETAMINOPHEN 325 MG PO TABS
650.0000 mg | ORAL_TABLET | Freq: Four times a day (QID) | ORAL | Status: DC | PRN
  Administered 2019-05-30 – 2019-06-01 (×3): 650 mg via ORAL
  Filled 2019-05-30 (×3): qty 2

## 2019-05-30 MED ORDER — PANTOPRAZOLE SODIUM 40 MG PO TBEC
40.0000 mg | DELAYED_RELEASE_TABLET | Freq: Every day | ORAL | Status: DC
  Administered 2019-05-30 – 2019-06-02 (×4): 40 mg via ORAL
  Filled 2019-05-30 (×4): qty 1

## 2019-05-30 MED ORDER — VITAMIN D (ERGOCALCIFEROL) 1.25 MG (50000 UNIT) PO CAPS
50000.0000 [IU] | ORAL_CAPSULE | ORAL | Status: DC
  Administered 2019-06-01: 50000 [IU] via ORAL
  Filled 2019-05-30: qty 1

## 2019-05-30 NOTE — ED Triage Notes (Signed)
Patient arrives from [facility] rehab center with abdominal pain and distension. Patient recently had R hip fracture repair; xray at facility showed ileus. Patient complaining of hip and abdominal pain

## 2019-05-30 NOTE — ED Provider Notes (Signed)
[facility] Emergency Department Provider Note  ____________________________________________   I have reviewed the triage vital signs and the nursing notes. Where available I have reviewed prior notes and, if possible and indicated, outside hospital notes.    HISTORY  Chief Complaint Abdominal Pain    HPI Cynthia Hart is a 85 y.o. female is recently admitted and discharged from this hospital, she did have surgery on 26 June for a hip fracture.  She states she believes she has had a bowel movement she is gone home but over the last couple days her abdomen has become more distended and she has left-sided abdominal pain.  No fever no vomiting that she knows of.  History is somewhat limited by patient mild dementia.  She does not want pain medication right now.  Name to notes, x-ray at the place where she stays shows an ileus.   Past Medical History:  Diagnosis Date  . Diabetes ([place])   . Hypertension   . Osteoarthritis   . Osteoporosis   . Postmenopausal   . Tumor   . Vitamin D deficiency     Patient Active Problem List   Diagnosis Date Noted  . Hip fracture ([place]) 05/22/2019  . Pre-operative cardiovascular examination 09/05/2018  . 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 Moderate to severe mitral regurgitation 09/05/2018  . Pulmonary hypertension, unspecified ([place]) 09/05/2018  . Postoperative examination 08/14/2018  . 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 Heart murmur 08/11/2018  . Dysphagia 08/11/2018    Past Surgical History:  Procedure Laterality Date  . CATARACT EXTRACTION    . CHOLECYSTECTOMY    . INTRAMEDULLARY (IM) NAIL INTERTROCHANTERIC Right 05/22/2019   Procedure: INTRAMEDULLARY (IM) NAIL INTERTROCHANTRIC;  Surgeon: Hessie Knows, MD;  Location: [facility] ORS;  Service: Orthopedics;  Laterality: Right;    Prior to Admission medications   Medication Sig Start Date End Date Taking? Authorizing Provider  acetaminophen (TYLENOL) 325  MG tablet Take 2 tablets (650 mg total) by mouth every 6 (six) hours as needed for mild pain (or Fever >/= 101). 05/24/19   Bettey Costa, MD  Cholecalciferol (VITAMIN D3) 50000 units CAPS Take 1 capsule by mouth every 30 (thirty) days.     [provider]  donepezil (ARICEPT) 10 MG tablet Take 10 mg by mouth at bedtime. 11/17/18 04/01/20  [provider]  enoxaparin (LOVENOX) 40 MG/0.4ML injection Inject 0.4 mLs (40 mg total) into the skin daily. 05/24/19   Lattie Corns, PA-C  fluticasone (FLONASE) 50 MCG/ACT nasal spray Place 1 spray into both nostrils daily. 01/08/19 01/08/20  [provider]  pantoprazole (PROTONIX) 40 MG tablet Take 1 tablet (40 mg total) by mouth daily. 05/24/19   Bettey Costa, MD  rOPINIRole (REQUIP) 0.25 MG tablet Take 1 tablet by mouth at bedtime. 07/31/18   [provider]    Allergies Alendronate, Codeine, Ibandronic acid, and Penicillins  Family History  Problem Relation Age of Onset  . Breast cancer Mother   . Dementia Brother   . Heart disease Maternal Grandmother   . Hypertension Paternal Grandfather     Social History Social History   Tobacco Use  . Smoking status: Never Smoker  . Smokeless tobacco: Never Used  Substance Use Topics  . Alcohol use: Not Currently  . Drug use: Never    Review of Systems Constitutional: No fever/chills Eyes: No visual changes. ENT: No sore throat. No stiff neck no neck pain Cardiovascular: Denies chest pain. Respiratory: Denies shortness of breath. Gastrointestinal:   no  vomiting.  No diarrhea.  No constipation. Genitourinary: Negative for dysuria. Musculoskeletal: Negative lower extremity swelling Skin: Negative for rash. Neurological: Negative for severe headaches, focal weakness or numbness.   ____________________________________________   PHYSICAL EXAM:  VITAL SIGNS: ED Triage Vitals  Enc Vitals Group     BP 05/30/19 1512 132/70     Pulse Rate 05/30/19 1512 70      Resp 05/30/19 1512 (!) 35     Temp 05/30/19 1516 98.8 F (37.1 C)     Temp Source 05/30/19 1516 Oral     SpO2 05/30/19 1512 100 %     Weight 05/30/19 1519 126 lb 8.7 oz (57.4 kg)     Height 05/30/19 1519 5\' 7"  (1.702 m)     Head Circumference --      Peak Flow --      Pain Score 05/30/19 1517 10     Pain Loc --      Pain Edu? --      Excl. in [place]? --     Constitutional: Alert and oriented. Well appearing and in no acute distress. Eyes: Conjunctivae are normal Head: Atraumatic HEENT: No congestion/rhinnorhea. Mucous membranes are moist.  Oropharynx non-erythematous Neck:   Nontender with no meningismus, no masses, no stridor Cardiovascular: Normal rate, regular rhythm. Grossly normal heart sounds.  Good peripheral circulation. Respiratory: Normal respiratory effort.  No retractions. Lungs CTAB. Abdominal: Soft, mildly distended, there is minimal left-sided abdominal tenderness but no guarding or rebound.  Diminished bowel sounds Back:  There is no focal tenderness or step off.  there is no midline tenderness there are no lesions noted. there is no CVA tenderness Musculoskeletal: No lower extremity tenderness, no upper extremity tenderness. No joint effusions, no DVT signs strong distal pulses no edema Neurologic:  Normal speech and language. No gross focal neurologic deficits are appreciated.  Skin:  Skin is warm, dry and intact. No rash noted. Psychiatric: Mood and affect are normal. Speech and behavior are normal.  ____________________________________________   LABS (all labs ordered are listed, but only abnormal results are displayed)  Labs Reviewed  CBC WITH DIFFERENTIAL/PLATELET - Abnormal; Notable for the following components:      Result Value   RBC 2.37 (*)    Hemoglobin 7.7 (*)    HCT 24.4 (*)    MCV 103.0 (*)    Platelets 461 (*)    Monocytes Absolute 1.6 (*)    Abs Immature Granulocytes 0.09 (*)    All other components within normal limits  COMPREHENSIVE  METABOLIC PANEL - Abnormal; Notable for the following components:   Sodium 130 (*)    Chloride 96 (*)    Glucose, Bld 108 (*)    Calcium 8.6 (*)    Total Protein 6.4 (*)    Albumin 2.9 (*)    All other components within normal limits  URINALYSIS, COMPLETE (UACMP) WITH MICROSCOPIC  LIPASE, BLOOD    Pertinent labs  results that were available during my care of the patient were reviewed by me and considered in my medical decision making (see chart for details). ____________________________________________  EKG  I personally interpreted any EKGs ordered by me or triage  ____________________________________________  RADIOLOGY  Pertinent labs & imaging results that were available during my care of the patient were reviewed by me and considered in my medical decision making (see chart for details). If possible, patient and/or family made aware of any abnormal findings.  No results found. ____________________________________________    PROCEDURES  Procedure(s) performed: None  Procedures  Critical Care performed: None  ____________________________________________   INITIAL IMPRESSION / ASSESSMENT AND PLAN / ED COURSE  Pertinent labs & imaging results that were available during my care of the patient were reviewed by me and considered in my medical decision making (see chart for details).  Patient with abdominal distention, but does state that she has been having bowel movements.  CT scan shows diffuse ileus but no obstruction, she does have urinary retention, we will place a Foley, could be that her urinary retention, postop, is causing her to have the ileus.  No evidence of surgical pathology noted.  We will see if we can drain her bladder and get her admitted for further care.    ____________________________________________   FINAL CLINICAL IMPRESSION(S) / ED DIAGNOSES  Final diagnoses:  None      This chart was dictated using voice recognition software.  Despite  best efforts to proofread,  errors can occur which can change meaning.      Schuyler Amor, MD 05/30/19 1754

## 2019-05-30 NOTE — ED Notes (Signed)
Patient transported to CT 

## 2019-05-30 NOTE — Progress Notes (Signed)
   [place] [facility] Day: 0 days Cynthia Hart is a 85 y.o. female known history of diabetes, Dementia, hypertension, osteoarthritis, osteoporosis, recent admission for a fall and right hip fracture status post ORIF who now returns back to the emergency room today complaining of abdominal pain and distention.  Given patient's dementia spoke with patient's daughter over the phone about patient's CODE STATUS.  Explained to her difference between DNR and full code which he understands.  Patient's daughter does want the patient resuscitated and she wants heroic measures to save her life if needed.  Patient remains a full code for now.   Advance care planning discussed with patient  without additional Family at bedside. All questions in regards to overall condition and expected prognosis answered. The decision was made to continue current code status  CODE STATUS: full Time spent: 16 minutes

## 2019-05-30 NOTE — ED Notes (Signed)
Attempted to call report to floor. RN not available.

## 2019-05-30 NOTE — ED Notes (Signed)
Patient's abdomen significantly less distended after placement of foley catheter. Got OK from MD to clamp catheter temporarily due to large amount of urine drained

## 2019-05-30 NOTE — ED Notes (Signed)
Admitting provider at bedside.

## 2019-05-30 NOTE — ED Notes (Signed)
Patient drinking contrast

## 2019-05-30 NOTE — ED Notes (Signed)
ED TO INPATIENT HANDOFF REPORT  ED Nurse Name and Phone #: Tana Conch [phone]  S Name/Age/Gender Cynthia Hart 85 y.o. female Room/Bed: ED09A/ED09A  Code Status   Code Status: Prior  Home/SNF/Other Rehab  Patient oriented to: self, place, time and situation Is this baseline? Yes   Triage Complete: Triage complete  Chief Complaint abd pain  Triage Note Patient arrives from [facility] rehab center with abdominal pain and distension. Patient recently had R hip fracture repair; xray at facility showed ileus. Patient complaining of hip and abdominal pain   Allergies Allergies  Allergen Reactions  . Alendronate Other (See Comments)    From Fosamax-Stomach Pain  . Codeine     Nausea   . Ibandronic Acid Other (See Comments)    From Boniva-Stomach Pain  . Penicillins     Nausea,rash and vomiting     Level of Care/Admitting Diagnosis ED Disposition    ED Disposition Condition [place] [facility] Area: [place] [[id number]]  Level of Care: Med-Surg [16]  Covid Evaluation: Person Under Investigation (PUI)  Diagnosis: Urinary retention [767341]  Admitting Physician: Henreitta Leber [[id number]]  Attending Physician: Henreitta Leber [[id number]]  PT Class (Do Not Modify): Observation [104]  PT Acc Code (Do Not Modify): Observation [10022]       B Medical/Surgery History Past Medical History:  Diagnosis Date  . Diabetes ([place])   . Hypertension   . Osteoarthritis   . Osteoporosis   . Postmenopausal   . Tumor   . Vitamin D deficiency    Past Surgical History:  Procedure Laterality Date  . CATARACT EXTRACTION    . CHOLECYSTECTOMY    . INTRAMEDULLARY (IM) NAIL INTERTROCHANTERIC Right 05/22/2019   Procedure: INTRAMEDULLARY (IM) NAIL INTERTROCHANTRIC;  Surgeon: Hessie Knows, MD;  Location: [facility] ORS;  Service: Orthopedics;  Laterality: Right;     A IV Location/Drains/Wounds Patient Lines/Drains/Airways Status   Active  Line/Drains/Airways    Name:   Placement date:   Placement time:   Site:   Days:   Peripheral IV 05/30/19 Left Antecubital   05/30/19    1522    Antecubital   less than 1   Urethral Catheter Adelene Idler RN Non-latex 16 Fr.   05/30/19    1810    Non-latex   less than 1   Incision (Closed) 05/22/19 Hip Right   05/22/19    1529     8          Intake/Output Last 24 hours  Intake/Output Summary (Last 24 hours) at 05/30/2019 2103 Last data filed at 05/30/2019 1848 Gross per 24 hour  Intake -  Output 1000 ml  Net -1000 ml    Labs/Imaging Results for orders placed or performed during the hospital encounter of 05/30/19 (from the past 48 hour(s))  CBC with Differential     Status: Abnormal   Collection Time: 05/30/19  3:18 PM  Result Value Ref Range   WBC 9.1 4.0 - 10.5 K/uL   RBC 2.37 (L) 3.87 - 5.11 MIL/uL   Hemoglobin 7.7 (L) 12.0 - 15.0 g/dL   HCT 24.4 (L) 36.0 - 46.0 %   MCV 103.0 (H) 80.0 - 100.0 fL   MCH 32.5 26.0 - 34.0 pg   MCHC 31.6 30.0 - 36.0 g/dL   RDW 15.4 11.5 - 15.5 %   Platelets 461 (H) 150 - 400 K/uL   nRBC 0.0 0.0 - 0.2 %   Neutrophils Relative % 68 %  Neutro Abs 6.2 1.7 - 7.7 K/uL   Lymphocytes Relative 13 %   Lymphs Abs 1.2 0.7 - 4.0 K/uL   Monocytes Relative 17 %   Monocytes Absolute 1.6 (H) 0.1 - 1.0 K/uL   Eosinophils Relative 1 %   Eosinophils Absolute 0.1 0.0 - 0.5 K/uL   Basophils Relative 0 %   Basophils Absolute 0.0 0.0 - 0.1 K/uL   Immature Granulocytes 1 %   Abs Immature Granulocytes 0.09 (H) 0.00 - 0.07 K/uL    Comment: Performed at [facility], [address] [place], [place] [address]  Comprehensive metabolic panel     Status: Abnormal   Collection Time: 05/30/19  3:18 PM  Result Value Ref Range   Sodium 130 (L) 135 - 145 mmol/L   Potassium 3.8 3.5 - 5.1 mmol/L   Chloride 96 (L) 98 - 111 mmol/L   CO2 24 22 - 32 mmol/L   Glucose, Bld 108 (H) 70 - 99 mg/dL   BUN 22 8 - 23 mg/dL   Creatinine, Ser 0.46 0.44 - 1.00 mg/dL   Calcium  8.6 (L) 8.9 - 10.3 mg/dL   Total Protein 6.4 (L) 6.5 - 8.1 g/dL   Albumin 2.9 (L) 3.5 - 5.0 g/dL   AST 30 15 - 41 U/L   ALT 24 0 - 44 U/L   Alkaline Phosphatase 69 38 - 126 U/L   Total Bilirubin 1.2 0.3 - 1.2 mg/dL   GFR calc non Af Amer >60 >60 mL/min   GFR calc Af Amer >60 >60 mL/min   Anion gap 10 5 - 15    Comment: Performed at [facility], [address] [place], [place] [address]  Lipase, blood     Status: Abnormal   Collection Time: 05/30/19  3:18 PM  Result Value Ref Range   Lipase 84 (H) 11 - 51 U/L    Comment: Performed at [facility], [address] [place],  [address]  Urinalysis, Complete w Microscopic     Status: Abnormal   Collection Time: 05/30/19  6:12 PM  Result Value Ref Range   Color, Urine YELLOW (A) YELLOW   APPearance CLEAR (A) CLEAR   Specific Gravity, Urine 1.016 1.005 - 1.030   pH 6.0 5.0 - 8.0   Glucose, UA NEGATIVE NEGATIVE mg/dL   Hgb urine dipstick NEGATIVE NEGATIVE   Bilirubin Urine NEGATIVE NEGATIVE   Ketones, ur NEGATIVE NEGATIVE mg/dL   Protein, ur NEGATIVE NEGATIVE mg/dL   Nitrite NEGATIVE NEGATIVE   Leukocytes,Ua NEGATIVE NEGATIVE   RBC / HPF 0-5 0 - 5 RBC/hpf   WBC, UA 0-5 0 - 5 WBC/hpf   Bacteria, UA NONE SEEN NONE SEEN   Squamous Epithelial / LPF NONE SEEN 0 - 5   Mucus PRESENT     Comment: Performed at [facility], [address], [place],  [address]  SARS Coronavirus 2 (CEPHEID - Performed in [place] hospital lab), Hosp Order     Status: None   Collection Time: 05/30/19  6:12 PM   Specimen: Nasopharyngeal Swab  Result Value Ref Range   SARS Coronavirus 2 NEGATIVE NEGATIVE    Comment: (NOTE) If result is NEGATIVE SARS-CoV-2 target nucleic acids are NOT DETECTED. The SARS-CoV-2 RNA is generally detectable in upper and lower  respiratory specimens during the acute phase of infection. The lowest  concentration of SARS-CoV-2 viral copies this assay can detect is 250  copies / mL. A  negative result does not preclude SARS-CoV-2  infection  and should not be used as the sole basis for treatment or other  patient management decisions.  A negative result may occur with  improper specimen collection / handling, submission of specimen other  than nasopharyngeal swab, presence of viral mutation(s) within the  areas targeted by this assay, and inadequate number of viral copies  (<250 copies / mL). A negative result must be combined with clinical  observations, patient history, and epidemiological information. If result is POSITIVE SARS-CoV-2 target nucleic acids are DETECTED. The SARS-CoV-2 RNA is generally detectable in upper and lower  respiratory specimens dur ing the acute phase of infection.  Positive  results are indicative of active infection with SARS-CoV-2.  Clinical  correlation with patient history and other diagnostic information is  necessary to determine patient infection status.  Positive results do  not rule out bacterial infection or co-infection with other viruses. If result is PRESUMPTIVE POSTIVE SARS-CoV-2 nucleic acids MAY BE PRESENT.   A presumptive positive result was obtained on the submitted specimen  and confirmed on repeat testing.  While 2019 novel coronavirus  (SARS-CoV-2) nucleic acids may be present in the submitted sample  additional confirmatory testing may be necessary for epidemiological  and / or clinical management purposes  to differentiate between  SARS-CoV-2 and other Sarbecovirus currently known to infect humans.  If clinically indicated additional testing with an alternate test  methodology [phone]) is advised. The SARS-CoV-2 RNA is generally  detectable in upper and lower respiratory sp ecimens during the acute  phase of infection. The expected result is Negative. Fact Sheet for Patients:  StrictlyIdeas.no Fact Sheet for Healthcare Providers: BankingDealers.co.za This test is not  yet approved or cleared by the [place] FDA and has been authorized for detection and/or diagnosis of SARS-CoV-2 by FDA under an Emergency Use Authorization (EUA).  This EUA will remain in effect (meaning this test can be used) for the duration of the COVID-19 declaration under Section 564(b)(1) of the Act, 21 U.S.C. section 360bbb-3(b)(1), unless the authorization is terminated or revoked sooner. Performed at [facility], [address] [place], [place] [address]    Ct Abdomen Pelvis W Contrast  Result Date: 05/30/2019 CLINICAL DATA:  Abdominal pain and distension. Recent right hip fracture repair. EXAM: CT ABDOMEN AND PELVIS WITH CONTRAST TECHNIQUE: Multidetector CT imaging of the abdomen and pelvis was performed using the standard protocol following bolus administration of intravenous contrast. CONTRAST:  96mL OMNIPAQUE IOHEXOL 300 MG/ML SOLN, 98mL OMNIPAQUE IOHEXOL 240 MG/ML SOLN COMPARISON:  None. FINDINGS: Lower chest: Small dependent bilateral pleural effusions with passive atelectasis at the dependent lung bases. Mildly patulous lower thoracic esophagus with layering oral contrast. A few scattered solid pulmonary nodules at the right lung base, largest 5 mm (series 5/image 5). Calcified granulomatous right infrahilar nodes. Hepatobiliary: Scattered calcified liver granulomas. Normal liver size. No liver mass. Cholecystectomy. No biliary ductal dilatation. Pancreas: Normal, with no mass or duct dilation. Spleen: Calcified splenic granulomas. Normal size spleen. No splenic mass. Adrenals/Urinary Tract: Normal adrenals. Simple 2.6 cm lower left renal cyst. Scattered small simple parapelvic left renal cysts. Additional scattered subcentimeter hypodense renal cortical lesions in both kidneys, too small to characterize, requiring no follow-up. No hydronephrosis. Prominently distended and otherwise normal bladder. Bladder volume approximately 15.6 x 13.5 x 13.7 cm (volume = 1510 cm^3).  Stomach/Bowel: Normal non-distended stomach. Normal caliber small bowel with no small bowel wall thickening. Normal appendix. There is moderate diffuse distention of colon and rectum with colorectal air-fluid levels. No  large bowel wall thickening, diverticulosis or significant pericolonic fat stranding. Vascular/Lymphatic: Atherosclerotic nonaneurysmal abdominal aorta. Patent portal, splenic, hepatic and renal veins. No pathologically enlarged lymph nodes in the abdomen or pelvis. Reproductive: Grossly normal uterus.  No adnexal mass. Other: No pneumoperitoneum, ascites or focal fluid collection. Musculoskeletal: No aggressive appearing focal osseous lesions. Mild anasarca. Partially visualized fixation hardware in the proximal right humerus transfixing a comminuted intertrochanteric right proximal femur fracture. Marked lower lumbar spondylosis. IMPRESSION: 1. Prominently distended bladder measuring 1510 cubic cm in volume. Please correlate clinically to exclude bladder outlet obstruction. No hydronephrosis. 2. Moderate diffuse colorectal distention with associated colorectal air-fluid levels. Findings likely represent adynamic ileus and malabsorptive state. No evidence of bowel obstruction. No bowel wall thickening. 3. Small dependent bilateral pleural effusions.  Mild anasarca. 4. Right lung base solid pulmonary nodules, largest 5 mm. No follow-up needed if patient is low-risk (and has no known or suspected primary neoplasm). Non-contrast chest CT can be considered in 12 months if patient is high-risk. This recommendation follows the consensus statement: Guidelines for Management of Incidental Pulmonary Nodules Detected on CT Images:From the Fleischner Society 2017; published online before print (10.1148/radiol.[id number]). Electronically Signed   By: Ilona Sorrel M.D.   On: 05/30/2019 17:41    Pending Labs [employer] (From admission, onward)    Start     Ordered   Signed and Research scientist (life sciences) morning,   R     Signed and Held   Signed and Held  CBC  Tomorrow morning,   R     Signed and Held          Vitals/Pain Today's Vitals   05/30/19 2000 05/30/19 2015 05/30/19 2030 05/30/19 2040  BP: (!) 119/58 118/64 (!) 116/58 (!) 116/58  Pulse:    71  Resp: 19 18 19 18   Temp:      TempSrc:      SpO2:    100%  Weight:      Height:      PainSc:    Asleep    Isolation Precautions No active isolations  Medications Medications  iohexol (OMNIPAQUE) 240 MG/ML injection 50 mL (50 mLs Oral Contrast Given 05/30/19 1625)  iohexol (OMNIPAQUE) 300 MG/ML solution 100 mL (75 mLs Intravenous Contrast Given 05/30/19 1713)    Mobility non-ambulatory Moderate fall risk   Focused Assessments Orthro   R Recommendations: See Admitting Provider Note  Report given to:   Additional Notes:

## 2019-05-30 NOTE — ED Notes (Signed)
Warm blanket given

## 2019-05-30 NOTE — H&P (Signed)
Cynthia Hart at [place] NAME: Cynthia Hart    MR#:  [id number]  DATE OF BIRTH:  Jul 28, 1927  DATE OF ADMISSION:  05/30/2019  PRIMARY CARE PHYSICIAN: Cynthia Johns, MD   REQUESTING/REFERRING PHYSICIAN: Dr. Charlotte Crumb  CHIEF COMPLAINT:   Chief Complaint  Patient presents with  . Abdominal Pain    HISTORY OF PRESENT ILLNESS:  Cynthia Hart  is a 85 y.o. female with a known history of diabetes, hypertension, osteoarthritis, osteoporosis, recent admission for a fall and right hip fracture status post ORIF who now returns back to the emergency room today complaining of abdominal pain and distention.  Patient says she was doing well at rehab when they noticed that her abdomen was distended and she was having some pain and therefore sent her to the hospital for further evaluation.  When patient arrived to the ER she was noted to be significantly distended underwent a CT scan of the abdomen pelvis which was suggestive of urinary retention also an adynamic ileus.  Patient denies any nausea vomiting fever chills cough or any other associated symptoms presently.  Her COVID-19 test is still pending.  Patient had a Foley catheter placed in the ER and drained about 1 L of urine and her abdominal distention has significantly improved already.  PAST MEDICAL HISTORY:   Past Medical History:  Diagnosis Date  . Diabetes ([place])   . Hypertension   . Osteoarthritis   . Osteoporosis   . Postmenopausal   . Tumor   . Vitamin D deficiency     PAST SURGICAL HISTORY:   Past Surgical History:  Procedure Laterality Date  . CATARACT EXTRACTION    . CHOLECYSTECTOMY    . INTRAMEDULLARY (IM) NAIL INTERTROCHANTERIC Right 05/22/2019   Procedure: INTRAMEDULLARY (IM) NAIL INTERTROCHANTRIC;  Surgeon: Hessie Knows, MD;  Location: [facility] ORS;  Service: Orthopedics;  Laterality: Right;    SOCIAL HISTORY:   Social History   Tobacco Use  . Smoking status: Never  Smoker  . Smokeless tobacco: Never Used  Substance Use Topics  . Alcohol use: Not Currently    FAMILY HISTORY:   Family History  Problem Relation Age of Onset  . Breast cancer Mother   . Dementia Brother   . Heart disease Maternal Grandmother   . Hypertension Paternal Grandfather     DRUG ALLERGIES:   Allergies  Allergen Reactions  . Alendronate Other (See Comments)    From Fosamax-Stomach Pain  . Codeine     Nausea   . Ibandronic Acid Other (See Comments)    From Boniva-Stomach Pain  . Penicillins     Nausea,rash and vomiting     REVIEW OF SYSTEMS:   Review of Systems  Constitutional: Negative for fever and weight loss.  HENT: Negative for congestion, nosebleeds and tinnitus.   Eyes: Negative for blurred vision, double vision and redness.  Respiratory: Negative for cough, hemoptysis and shortness of breath.   Cardiovascular: Negative for chest pain, orthopnea, leg swelling and PND.  Gastrointestinal: Positive for abdominal pain. Negative for diarrhea, melena, nausea and vomiting.  Genitourinary: Negative for dysuria, hematuria and urgency.       Urinary Retention.   Musculoskeletal: Negative for falls and joint pain.  Neurological: Negative for dizziness, tingling, sensory change, focal weakness, seizures, weakness and headaches.  Endo/Heme/Allergies: Negative for polydipsia. Does not bruise/bleed easily.  Psychiatric/Behavioral: Negative for depression and memory loss. The patient is not nervous/anxious.     MEDICATIONS AT HOME:   Prior  to Admission medications   Medication Sig Start Date End Date Taking? Authorizing Provider  acetaminophen (TYLENOL) 325 MG tablet Take 2 tablets (650 mg total) by mouth every 6 (six) hours as needed for mild pain (or Fever >/= 101). 05/24/19  Yes Bettey Costa, MD  Cholecalciferol (VITAMIN D3) 50000 units CAPS Take 1 capsule by mouth every 30 (thirty) days.    Yes [provider]  donepezil (ARICEPT) 10 MG tablet Take 10  mg by mouth at bedtime. 11/17/18 04/01/20 Yes [provider]  enoxaparin (LOVENOX) 40 MG/0.4ML injection Inject 0.4 mLs (40 mg total) into the skin daily. 05/24/19  Yes Lattie Corns, PA-C  fluticasone [facility]) 50 MCG/ACT nasal spray Place 1 spray into both nostrils daily. 01/08/19 01/08/20 Yes [provider]  pantoprazole (PROTONIX) 40 MG tablet Take 1 tablet (40 mg total) by mouth daily. 05/24/19  Yes Mody, Ulice Bold, MD  rOPINIRole (REQUIP) 0.25 MG tablet Take 1 tablet by mouth at bedtime. 07/31/18  Yes [provider]  traMADol (ULTRAM) 50 MG tablet Take 50 mg by mouth every 6 (six) hours as needed.   Yes [provider]      VITAL SIGNS:  Blood pressure 122/68, pulse 73, temperature 98.8 F (37.1 C), temperature source Oral, resp. rate (!) 22, height 5\' 7"  (1.702 m), weight 57.4 kg, SpO2 93 %.  PHYSICAL EXAMINATION:  Physical Exam  GENERAL:  85 y.o.-year-old patient lying in the bed in no acute distress.  EYES: Pupils equal, round, reactive to light and accommodation. No scleral icterus. Extraocular muscles intact.  HEENT: Head atraumatic, normocephalic. Oropharynx and nasopharynx clear. No oropharyngeal erythema, moist oral mucosa  NECK:  Supple, no jugular venous distention. No thyroid enlargement, no tenderness.  LUNGS: Normal breath sounds bilaterally, no wheezing, rales, rhonchi. No use of accessory muscles of respiration.  CARDIOVASCULAR: S1, S2 RRR.  2 out of 6 systolic ejection murmur noted at the base, No rubs, gallops, clicks.  ABDOMEN: Soft, NT, Distended. Bowel sounds Hypoactive. No organomegaly or mass.  EXTREMITIES: No pedal edema, cyanosis, or clubbing. + 2 pedal & radial pulses b/l.  Right hip dressing in place from recent hip surgery with no acute drainage noted. NEUROLOGIC: Cranial nerves II through XII are intact. No focal Motor or sensory deficits appreciated b/l. Globally weak.  PSYCHIATRIC: The patient is alert and oriented x 3.   SKIN: No obvious rash, lesion, or ulcer.   LABORATORY PANEL:   CBC Recent Labs  Lab 05/30/19 1518  WBC 9.1  HGB 7.7*  HCT 24.4*  PLT 461*   ------------------------------------------------------------------------------------------------------------------  Chemistries  Recent Labs  Lab 05/30/19 1518  NA 130*  K 3.8  CL 96*  CO2 24  GLUCOSE 108*  BUN 22  CREATININE 0.46  CALCIUM 8.6*  AST 30  ALT 24  ALKPHOS 69  BILITOT 1.2   ------------------------------------------------------------------------------------------------------------------  Cardiac Enzymes No results for input(s): TROPONINI in the last 168 hours. ------------------------------------------------------------------------------------------------------------------  RADIOLOGY:  Ct Abdomen Pelvis W Contrast  Result Date: 05/30/2019 CLINICAL DATA:  Abdominal pain and distension. Recent right hip fracture repair. EXAM: CT ABDOMEN AND PELVIS WITH CONTRAST TECHNIQUE: Multidetector CT imaging of the abdomen and pelvis was performed using the standard protocol following bolus administration of intravenous contrast. CONTRAST:  44mL OMNIPAQUE IOHEXOL 300 MG/ML SOLN, 26mL OMNIPAQUE IOHEXOL 240 MG/ML SOLN COMPARISON:  None. FINDINGS: Lower chest: Small dependent bilateral pleural effusions with passive atelectasis at the dependent lung bases. Mildly patulous lower thoracic esophagus with layering oral contrast. A few scattered  solid pulmonary nodules at the right lung base, largest 5 mm (series 5/image 5). Calcified granulomatous right infrahilar nodes. Hepatobiliary: Scattered calcified liver granulomas. Normal liver size. No liver mass. Cholecystectomy. No biliary ductal dilatation. Pancreas: Normal, with no mass or duct dilation. Spleen: Calcified splenic granulomas. Normal size spleen. No splenic mass. Adrenals/Urinary Tract: Normal adrenals. Simple 2.6 cm lower left renal cyst. Scattered small simple parapelvic left renal  cysts. Additional scattered subcentimeter hypodense renal cortical lesions in both kidneys, too small to characterize, requiring no follow-up. No hydronephrosis. Prominently distended and otherwise normal bladder. Bladder volume approximately 15.6 x 13.5 x 13.7 cm (volume = 1510 cm^3). Stomach/Bowel: Normal non-distended stomach. Normal caliber small bowel with no small bowel wall thickening. Normal appendix. There is moderate diffuse distention of colon and rectum with colorectal air-fluid levels. No large bowel wall thickening, diverticulosis or significant pericolonic fat stranding. Vascular/Lymphatic: Atherosclerotic nonaneurysmal abdominal aorta. Patent portal, splenic, hepatic and renal veins. No pathologically enlarged lymph nodes in the abdomen or pelvis. Reproductive: Grossly normal uterus.  No adnexal mass. Other: No pneumoperitoneum, ascites or focal fluid collection. Musculoskeletal: No aggressive appearing focal osseous lesions. Mild anasarca. Partially visualized fixation hardware in the proximal right humerus transfixing a comminuted intertrochanteric right proximal femur fracture. Marked lower lumbar spondylosis. IMPRESSION: 1. Prominently distended bladder measuring 1510 cubic cm in volume. Please correlate clinically to exclude bladder outlet obstruction. No hydronephrosis. 2. Moderate diffuse colorectal distention with associated colorectal air-fluid levels. Findings likely represent adynamic ileus and malabsorptive state. No evidence of bowel obstruction. No bowel wall thickening. 3. Small dependent bilateral pleural effusions.  Mild anasarca. 4. Right lung base solid pulmonary nodules, largest 5 mm. No follow-up needed if patient is low-risk (and has no known or suspected primary neoplasm). Non-contrast chest CT can be considered in 12 months if patient is high-risk. This recommendation follows the consensus statement: Guidelines for Management of Incidental Pulmonary Nodules Detected on CT  Images:From the Fleischner Society 2017; published online before print (10.1148/[id number]). Electronically Signed   By: Ilona Sorrel M.D.   On: 05/30/2019 17:41     IMPRESSION AND PLAN:   85 y.o. female with a known history of diabetes, hypertension, osteoarthritis, osteoporosis, recent admission for a fall and right hip fracture status post ORIF who now returns back to the emergency room today complaining of abdominal pain and distention.  1.  Abdominal pain/distention-secondary to urinary retention also ileus is noted on the CT scan of the abdomen pelvis. -Patient is status post Foley catheter placement and already drained about a liter of urine and her abdominal distention is improved. - We will continue Foley catheter for now.  We will also place the patient on stool softeners with MiraLAX and lactulose. -We will repeat KUB in the morning. -Patient is not vomiting or nauseous therefore hold off on placing an NG tube for now.  2.  Urinary retention-patient is status post Foley catheter placement and has drained a liter of urine. - We will start the patient on some Flomax. - We will attempt post voiding trial in the next 24 to 48 hours.  3.  Ileus-this was noted on the CT scan of the abdomen pelvis.  Patient has no nausea vomiting. - This is secondary to poor mobility from recent hip surgery also complicated with underlying urinary retention. - Continue stool softeners.  4.  Dementia-continue Aricept.  5.  Status post fall and right hip fracture status post ORIF- we will resume physical therapy, continue Lovenox for DVT prophylaxis.  6.  GERD-continue Protonix.  7.  Restless leg syndrome-continue Requip.    All the records are reviewed and case discussed with ED provider. Management plans discussed with the patient, family and they are in agreement.  CODE STATUS: Full code  TOTAL TIME TAKING CARE OF THIS PATIENT: 40 minutes.    Henreitta Leber M.D on 05/30/2019 at 7:22  PM  Between 7am to 6pm - Pager - [phone]  After 6pm go to [email or web address] - password [email or web address] [facility]  [facility]  Office  [phone]  CC: Primary care physician; Cynthia Johns, MD

## 2019-05-31 ENCOUNTER — Observation Stay: Payer: Medicare Other

## 2019-05-31 DIAGNOSIS — K567 Ileus, unspecified: Secondary | ICD-10-CM | POA: Diagnosis present

## 2019-05-31 DIAGNOSIS — E876 Hypokalemia: Secondary | ICD-10-CM | POA: Diagnosis not present

## 2019-05-31 DIAGNOSIS — Z880 Allergy status to penicillin: Secondary | ICD-10-CM | POA: Diagnosis not present

## 2019-05-31 DIAGNOSIS — G2581 Restless legs syndrome: Secondary | ICD-10-CM | POA: Diagnosis present

## 2019-05-31 DIAGNOSIS — E119 Type 2 diabetes mellitus without complications: Secondary | ICD-10-CM | POA: Diagnosis present

## 2019-05-31 DIAGNOSIS — Z803 Family history of malignant neoplasm of breast: Secondary | ICD-10-CM | POA: Diagnosis not present

## 2019-05-31 DIAGNOSIS — I10 Essential (primary) hypertension: Secondary | ICD-10-CM | POA: Diagnosis present

## 2019-05-31 DIAGNOSIS — Z8249 Family history of ischemic heart disease and other diseases of the circulatory system: Secondary | ICD-10-CM | POA: Diagnosis not present

## 2019-05-31 DIAGNOSIS — Z1159 Encounter for screening for other viral diseases: Secondary | ICD-10-CM | POA: Diagnosis not present

## 2019-05-31 DIAGNOSIS — Z885 Allergy status to narcotic agent status: Secondary | ICD-10-CM | POA: Diagnosis not present

## 2019-05-31 DIAGNOSIS — R109 Unspecified abdominal pain: Secondary | ICD-10-CM | POA: Diagnosis present

## 2019-05-31 DIAGNOSIS — Z79891 Long term (current) use of opiate analgesic: Secondary | ICD-10-CM | POA: Diagnosis not present

## 2019-05-31 DIAGNOSIS — K219 Gastro-esophageal reflux disease without esophagitis: Secondary | ICD-10-CM | POA: Diagnosis present

## 2019-05-31 DIAGNOSIS — Z7951 Long term (current) use of inhaled steroids: Secondary | ICD-10-CM | POA: Diagnosis not present

## 2019-05-31 DIAGNOSIS — Z9049 Acquired absence of other specified parts of digestive tract: Secondary | ICD-10-CM | POA: Diagnosis not present

## 2019-05-31 DIAGNOSIS — Z888 Allergy status to other drugs, medicaments and biological substances status: Secondary | ICD-10-CM | POA: Diagnosis not present

## 2019-05-31 DIAGNOSIS — E43 Unspecified severe protein-calorie malnutrition: Secondary | ICD-10-CM | POA: Diagnosis present

## 2019-05-31 DIAGNOSIS — M810 Age-related osteoporosis without current pathological fracture: Secondary | ICD-10-CM | POA: Diagnosis present

## 2019-05-31 DIAGNOSIS — R339 Retention of urine, unspecified: Secondary | ICD-10-CM | POA: Diagnosis present

## 2019-05-31 DIAGNOSIS — D649 Anemia, unspecified: Secondary | ICD-10-CM | POA: Diagnosis not present

## 2019-05-31 DIAGNOSIS — F0390 Unspecified dementia without behavioral disturbance: Secondary | ICD-10-CM | POA: Diagnosis present

## 2019-05-31 DIAGNOSIS — Z79899 Other long term (current) drug therapy: Secondary | ICD-10-CM | POA: Diagnosis not present

## 2019-05-31 LAB — CBC
HCT: 23.5 % — ABNORMAL LOW (ref 36.0–46.0)
Hemoglobin: 7.5 g/dL — ABNORMAL LOW (ref 12.0–15.0)
MCH: 32.2 pg (ref 26.0–34.0)
MCHC: 31.9 g/dL (ref 30.0–36.0)
MCV: 100.9 fL — ABNORMAL HIGH (ref 80.0–100.0)
Platelets: 394 10*3/uL (ref 150–400)
RBC: 2.33 MIL/uL — ABNORMAL LOW (ref 3.87–5.11)
RDW: 15.7 % — ABNORMAL HIGH (ref 11.5–15.5)
WBC: 7.2 10*3/uL (ref 4.0–10.5)
nRBC: 0 % (ref 0.0–0.2)

## 2019-05-31 LAB — BASIC METABOLIC PANEL
Anion gap: 5 (ref 5–15)
BUN: 15 mg/dL (ref 8–23)
CO2: 27 mmol/L (ref 22–32)
Calcium: 8.2 mg/dL — ABNORMAL LOW (ref 8.9–10.3)
Chloride: 101 mmol/L (ref 98–111)
Creatinine, Ser: 0.46 mg/dL (ref 0.44–1.00)
GFR calc Af Amer: >60 mL/min (ref >60)
GFR calc non Af Amer: >60 mL/min (ref >60)
Glucose, Bld: 91 mg/dL (ref 70–99)
Potassium: 3.3 mmol/L — ABNORMAL LOW (ref 3.5–5.1)
Sodium: 133 mmol/L — ABNORMAL LOW (ref 135–145)

## 2019-05-31 MED ORDER — BISACODYL 10 MG RE SUPP: 10.0000 mg | Freq: Once | RECTAL | Status: DC

## 2019-05-31 MED ORDER — POTASSIUM CHLORIDE CRYS ER 20 MEQ PO TBCR
40.0000 meq | EXTENDED_RELEASE_TABLET | Freq: Once | ORAL | Status: AC
  Administered 2019-05-31: 40 meq via ORAL
  Filled 2019-05-31: qty 2

## 2019-05-31 MED ORDER — ENOXAPARIN SODIUM 40 MG/0.4ML ~~LOC~~ SOLN
30.0000 mg | SUBCUTANEOUS | Status: DC
  Administered 2019-06-01: 10:00:00 30 mg via SUBCUTANEOUS
  Filled 2019-05-31 (×2): qty 0.4

## 2019-05-31 NOTE — Plan of Care (Signed)
  Problem: Education: Goal: Knowledge of General Education information will improve Description: Including pain rating scale, medication(s)/side effects and non-pharmacologic comfort measures Outcome: Progressing   Problem: Clinical Measurements: Goal: Ability to maintain clinical measurements within normal limits will improve Outcome: Progressing Goal: Will remain free from infection Outcome: Progressing Goal: Diagnostic test results will improve Outcome: Progressing Goal: Respiratory complications will improve Outcome: Progressing Goal: Cardiovascular complication will be avoided Outcome: Progressing   Problem: Activity: Goal: Risk for activity intolerance will decrease Outcome: Progressing   Problem: Nutrition: Goal: Adequate nutrition will be maintained Outcome: Progressing   Problem: Coping: Goal: Level of anxiety will decrease Outcome: Progressing   Problem: Pain Managment: Goal: General experience of comfort will improve Outcome: Progressing   Problem: Safety: Goal: Ability to remain free from injury will improve Outcome: Progressing   

## 2019-05-31 NOTE — Care Management Obs Status (Signed)
[place] NOTIFICATION   Patient Details  Name: Nathalia Wismer MRN: [id number] Date of Birth: 07-27-1927   Medicare Observation Status Notification Given:  Yes    Juleon Narang A Shiya Fogelman, RN 05/31/2019, 9:24 AM

## 2019-05-31 NOTE — Progress Notes (Signed)
[facility] [place] NAME: Alysia Scism    MR#:  [id number]  DATE OF BIRTH:  1927-11-10  SUBJECTIVE:  CHIEF COMPLAINT:   Chief Complaint  Patient presents with  . Abdominal Pain   Better. Still feels full in her abd. Had small BM overnight Foley in place  REVIEW OF SYSTEMS:    Review of Systems  Constitutional: Positive for malaise/fatigue. Negative for chills, fever and weight loss.  HENT: Negative for hearing loss and nosebleeds.   Eyes: Negative for blurred vision, double vision and pain.  Respiratory: Negative for cough, hemoptysis, sputum production, shortness of breath and wheezing.   Cardiovascular: Negative for chest pain, palpitations, orthopnea and leg swelling.  Gastrointestinal: Positive for abdominal pain. Negative for constipation, diarrhea, nausea and vomiting.  Genitourinary: Negative for dysuria and hematuria.  Musculoskeletal: Negative for back pain, falls and myalgias.  Skin: Negative for rash.  Neurological: Negative for dizziness, tremors, sensory change, speech change, focal weakness, seizures and headaches.  Endo/Heme/Allergies: Does not bruise/bleed easily.  Psychiatric/Behavioral: Negative for depression and memory loss. The patient is not nervous/anxious.     DRUG ALLERGIES:   Allergies  Allergen Reactions  . Alendronate Other (See Comments)    From Fosamax-Stomach Pain  . Codeine     Nausea   . Ibandronic Acid Other (See Comments)    From Boniva-Stomach Pain  . Penicillins     Nausea,rash and vomiting     VITALS:  Blood pressure (!) 102/55, pulse 65, temperature 98.4 F (36.9 C), resp. rate 19, height 5\' 9"  (1.753 m), weight 37.2 kg, SpO2 100 %.  PHYSICAL EXAMINATION:   Physical Exam  GENERAL:  85 y.o.-year-old patient lying in the bed with no acute distress.  EYES: Pupils equal, round, reactive to light and accommodation. No scleral icterus. Extraocular muscles intact.  HEENT: Head  atraumatic, normocephalic. Oropharynx and nasopharynx clear.  NECK:  Supple, no jugular venous distention. No thyroid enlargement, no tenderness.  LUNGS: Normal breath sounds bilaterally, no wheezing, rales, rhonchi. No use of accessory muscles of respiration.  CARDIOVASCULAR: S1, S2 normal. No murmurs, rubs, or gallops.  ABDOMEN: Soft, mild diffuse tender, nondistended. Bowel sounds present. No organomegaly or mass.  EXTREMITIES: No cyanosis, clubbing or edema b/l.    NEUROLOGIC: Cranial nerves II through XII are intact. No focal Motor or sensory deficits b/l.   PSYCHIATRIC: The patient is alert and oriented x 3.  SKIN: No obvious rash, lesion, or ulcer.   LABORATORY PANEL:   CBC Recent Labs  Lab 05/31/19 0611  WBC 7.2  HGB 7.5*  HCT 23.5*  PLT 394   ------------------------------------------------------------------------------------------------------------------ Chemistries  Recent Labs  Lab 05/30/19 1518 05/31/19 0611  NA 130* 133*  K 3.8 3.3*  CL 96* 101  CO2 24 27  GLUCOSE 108* 91  BUN 22 15  CREATININE 0.46 0.46  CALCIUM 8.6* 8.2*  AST 30  --   ALT 24  --   ALKPHOS 69  --   BILITOT 1.2  --    ------------------------------------------------------------------------------------------------------------------  Cardiac Enzymes No results for input(s): TROPONINI in the last 168 hours. ------------------------------------------------------------------------------------------------------------------  RADIOLOGY:  Dg Abd 1 View  Result Date: 05/31/2019 CLINICAL DATA:  Ileus.  Abdominal pain. EXAM: ABDOMEN - 1 VIEW COMPARISON:  CT scan May 30, 2019. FINDINGS: No significant small bowel dilatation is noted. However, distended air-filled colon is noted which contains residual contrast, which may represent ileus. Status post cholecystectomy. IMPRESSION: Distended air-filled colon is noted which contains  residual contrast, concerning for ileus. Electronically Signed   By: Marijo Conception M.D.   On: 05/31/2019 08:52   Ct Abdomen Pelvis W Contrast  Result Date: 05/30/2019 CLINICAL DATA:  Abdominal pain and distension. Recent right hip fracture repair. EXAM: CT ABDOMEN AND PELVIS WITH CONTRAST TECHNIQUE: Multidetector CT imaging of the abdomen and pelvis was performed using the standard protocol following bolus administration of intravenous contrast. CONTRAST:  13mL OMNIPAQUE IOHEXOL 300 MG/ML SOLN, 29mL OMNIPAQUE IOHEXOL 240 MG/ML SOLN COMPARISON:  None. FINDINGS: Lower chest: Small dependent bilateral pleural effusions with passive atelectasis at the dependent lung bases. Mildly patulous lower thoracic esophagus with layering oral contrast. A few scattered solid pulmonary nodules at the right lung base, largest 5 mm (series 5/image 5). Calcified granulomatous right infrahilar nodes. Hepatobiliary: Scattered calcified liver granulomas. Normal liver size. No liver mass. Cholecystectomy. No biliary ductal dilatation. Pancreas: Normal, with no mass or duct dilation. Spleen: Calcified splenic granulomas. Normal size spleen. No splenic mass. Adrenals/Urinary Tract: Normal adrenals. Simple 2.6 cm lower left renal cyst. Scattered small simple parapelvic left renal cysts. Additional scattered subcentimeter hypodense renal cortical lesions in both kidneys, too small to characterize, requiring no follow-up. No hydronephrosis. Prominently distended and otherwise normal bladder. Bladder volume approximately 15.6 x 13.5 x 13.7 cm (volume = 1510 cm^3). Stomach/Bowel: Normal non-distended stomach. Normal caliber small bowel with no small bowel wall thickening. Normal appendix. There is moderate diffuse distention of colon and rectum with colorectal air-fluid levels. No large bowel wall thickening, diverticulosis or significant pericolonic fat stranding. Vascular/Lymphatic: Atherosclerotic nonaneurysmal abdominal aorta. Patent portal, splenic, hepatic and renal veins. No pathologically enlarged lymph  nodes in the abdomen or pelvis. Reproductive: Grossly normal uterus.  No adnexal mass. Other: No pneumoperitoneum, ascites or focal fluid collection. Musculoskeletal: No aggressive appearing focal osseous lesions. Mild anasarca. Partially visualized fixation hardware in the proximal right humerus transfixing a comminuted intertrochanteric right proximal femur fracture. Marked lower lumbar spondylosis. IMPRESSION: 1. Prominently distended bladder measuring 1510 cubic cm in volume. Please correlate clinically to exclude bladder outlet obstruction. No hydronephrosis. 2. Moderate diffuse colorectal distention with associated colorectal air-fluid levels. Findings likely represent adynamic ileus and malabsorptive state. No evidence of bowel obstruction. No bowel wall thickening. 3. Small dependent bilateral pleural effusions.  Mild anasarca. 4. Right lung base solid pulmonary nodules, largest 5 mm. No follow-up needed if patient is low-risk (and has no known or suspected primary neoplasm). Non-contrast chest CT can be considered in 12 months if patient is high-risk. This recommendation follows the consensus statement: Guidelines for Management of Incidental Pulmonary Nodules Detected on CT Images:From the Fleischner Society 2017; published online before print (10.1148/radiol.[id number]). Electronically Signed   By: Ilona Sorrel M.D.   On: 05/30/2019 17:41     ASSESSMENT AND PLAN:   85 y.o. female with a known history of diabetes, hypertension, osteoarthritis, osteoporosis, recent admission for a fall and right hip fracture status post ORIF who now returns back to the emergency room today complaining of abdominal pain and distention.  * Ileus- Still has ileus on Xray today and patient doesn't fell well. Small BM yesterday and passing gas. No vomiting Will try suppository  * Urinary retention-patient is status post Foley catheter placement  Started the patient on some Flomax. Voiding trial in a week  *   Dementia-continue Aricept.  * Recent  fall and right hip fracture status post ORIF- we will resume physical therapy, continue Lovenox for DVT prophylaxis.  *  GERD-continue Protonix.  *  Restless leg syndrome-continue Requip.  * Anemia - stable  All the records are reviewed and case discussed with Care Management/Social Worker Management plans discussed with the patient, family and they are in agreement.  CODE STATUS: FULL CODE  TOTAL TIME TAKING CARE OF THIS PATIENT: 35 minutes.   POSSIBLE D/C IN 1-2 DAYS, DEPENDING ON CLINICAL CONDITION.  Leia Alf Sritha Chauncey M.D on 05/31/2019 at 11:01 AM  Between 7am to 6pm - Pager - [phone]  After 6pm go to [email or web address] - password [email or web address] [facility]  Office  [phone]  CC: Primary care physician; Nicholos Johns, MD  Note: This dictation was prepared with Dragon dictation along with smaller phrase technology. Any transcriptional errors that result from this process are unintentional.

## 2019-06-01 ENCOUNTER — Inpatient Hospital Stay: Payer: Medicare Other

## 2019-06-01 MED ORDER — ENSURE ENLIVE PO LIQD
237.0000 mL | Freq: Two times a day (BID) | ORAL | Status: DC
  Administered 2019-06-01: 237 mL via ORAL

## 2019-06-01 MED ORDER — FLEET ENEMA 7-19 GM/118ML RE ENEM
1.0000 | ENEMA | Freq: Once | RECTAL | Status: AC
  Administered 2019-06-01: 1 via RECTAL

## 2019-06-01 MED ORDER — DIPHENHYDRAMINE HCL 25 MG PO CAPS: 25.0000 mg | ORAL_CAPSULE | Freq: Three times a day (TID) | ORAL | Status: DC | PRN

## 2019-06-01 MED ORDER — TRAMADOL HCL 50 MG PO TABS
50.0000 mg | ORAL_TABLET | ORAL | Status: AC
  Administered 2019-06-01: 50 mg via ORAL
  Filled 2019-06-01: qty 1

## 2019-06-01 MED ORDER — ENOXAPARIN SODIUM 30 MG/0.3ML ~~LOC~~ SOLN
30.0000 mg | SUBCUTANEOUS | Status: DC
  Administered 2019-06-01 – 2019-06-02 (×2): 30 mg via SUBCUTANEOUS
  Filled 2019-06-01 (×2): qty 0.3

## 2019-06-01 MED ORDER — DIPHENHYDRAMINE HCL 25 MG PO CAPS
25.0000 mg | ORAL_CAPSULE | Freq: Four times a day (QID) | ORAL | Status: DC | PRN
  Administered 2019-06-01: 25 mg via ORAL
  Filled 2019-06-01: qty 1

## 2019-06-01 MED ORDER — ADULT MULTIVITAMIN W/MINERALS CH
1.0000 | ORAL_TABLET | Freq: Every day | ORAL | Status: DC
  Administered 2019-06-01 – 2019-06-02 (×2): 1 via ORAL
  Filled 2019-06-01 (×2): qty 1

## 2019-06-01 NOTE — Evaluation (Signed)
Physical Therapy Evaluation Patient Details Name: Cynthia Hart MRN: [id number] DOB: [birth year] Today's Date: 06/01/2019   History of Present Illness  Pt is a 85 y.o. female presenting to hospital 05/30/19 with abdominal pain and distention.  Pt admitted with ileus and urinary retention.  Pt with recent fall and s/p IMN R LE 05/22/19 and discharged to [facility] 05/25/19.  PMH includes DM, htn, myxomatous mitral valve regurgitation.  Clinical Impression  Prior to recent hospital admissions, pt was modified independent with functional mobility but pt recently discharged to [facility] s/p R LE IMN d/t fall.  Currently pt is min assist logrolling to L side (assist for R LE); limited mobility d/t significant R hip/thigh pain with R LE movement and also pt with recent enema.  R LE ex's required modification (with assist levels and ROM) d/t significant R hip/thigh pain.  Nurse notified of pt's request for pain meds (pt repositioned in bed for improved comfort).  Pt would benefit from skilled PT to address noted impairments and functional limitations (see below for any additional details).  Upon hospital discharge, recommend pt discharge back to [facility].    Follow Up Recommendations SNF    Equipment Recommendations  Rolling walker with 5" wheels;Wheelchair (measurements PT);Wheelchair cushion (measurements PT);3in1 (PT)    Recommendations for Other Services OT consult     Precautions / Restrictions Precautions Precautions: Fall Restrictions Weight Bearing Restrictions: Yes RLE Weight Bearing: Weight bearing as tolerated      Mobility  Bed Mobility Overal bed mobility: Needs Assistance Bed Mobility: Rolling Rolling: Min assist         General bed mobility comments: logroll to L side with heavy use of bed rail; assist for R LE d/t significant pain with movement  Transfers                 General transfer comment: Deferred d/t significant pain reports and recent enema  Ambulation/Gait              General Gait Details: Deferred d/t significant pain reports and recent enema  Stairs            Wheelchair Mobility    Modified Rankin (Stroke Patients Only)       Balance                                             Pertinent Vitals/Pain Pain Assessment: 0-10 Pain Location: R hip/LE; abdomen Pain Descriptors / Indicators: Aching;Grimacing;Guarding Pain Intervention(s): Limited activity within patient's tolerance;Monitored during session;Repositioned;Patient requesting pain meds-RN notified(Discussed possible order for ice pack for R hip for pain control)  Vitals (HR and O2 on 2 L O2 via nasal cannula) stable and WFL throughout treatment session.    Home Living Family/patient expects to be discharged to:: Private residence                 Additional Comments: [facility]    Prior Function Level of Independence: Independent with assistive device(s)         Comments: Mod indep with 4WRW for ADLs, household and limited community mobilization (per recent PT evaluation)     Hand Dominance        Extremity/Trunk Assessment   Upper Extremity Assessment Upper Extremity Assessment: Generalized weakness    Lower Extremity Assessment Lower Extremity Assessment: RLE deficits/detail;LLE deficits/detail RLE Deficits / Details: R hip flexion at least  2+/5; knee flexion/extension at least 2+/5; DF/PF at least 3/5 AROM RLE: Unable to fully assess due to pain LLE Deficits / Details: strength and ROM WFL       Communication   Communication: No difficulties  Cognition Arousal/Alertness: Awake/alert Behavior During Therapy: WFL for tasks assessed/performed Overall Cognitive Status: Within Functional Limits for tasks assessed                                        General Comments General comments (skin integrity, edema, etc.): yellow drainage noted R hip/thigh honeycomb dressing; red/pink tinged urine  (cloudy) noted in foley catheter (nurse notified).  Nursing cleared pt for participation in physical therapy.  Pt agreeable to PT session.    Exercises Total Joint Exercises Ankle Circles/Pumps: AROM;Strengthening;Both;10 reps;Supine Quad Sets: AROM;Strengthening;Both;10 reps;Supine Heel Slides: AROM;Left;AAROM;Right;Strengthening;10 reps;Supine Hip ABduction/ADduction: AROM;Left;AAROM;Right;Strengthening;Supine(10 reps L LE; 4 reps R LE (limited d/t R hip/thigh pain))   Assessment/Plan    PT Assessment Patient needs continued PT services  PT Problem List Decreased strength;Decreased range of motion;Decreased activity tolerance;Decreased balance;Decreased mobility;Decreased coordination;Decreased cognition;Decreased knowledge of use of DME;Decreased safety awareness;Decreased knowledge of precautions;Pain;Decreased skin integrity       PT Treatment Interventions DME instruction;Gait training;Functional mobility training;Therapeutic activities;Therapeutic exercise;Balance training;Patient/family education    PT Goals (Current goals can be found in the Care Plan section)  Acute Rehab PT Goals Patient Stated Goal: to have less pain PT Goal Formulation: With patient Time For Goal Achievement: 06/15/19 Potential to Achieve Goals: Fair    Frequency 7X/week   Barriers to discharge Decreased caregiver support      Co-evaluation               AM-PAC PT "6 Clicks" Mobility  Outcome Measure Help needed turning from your back to your side while in a flat bed without using bedrails?: A Lot Help needed moving from lying on your back to sitting on the side of a flat bed without using bedrails?: A Lot Help needed moving to and from a bed to a chair (including a wheelchair)?: Total Help needed standing up from a chair using your arms (e.g., wheelchair or bedside chair)?: Total Help needed to walk in hospital room?: Total Help needed climbing 3-5 steps with a railing? : Total 6 Click  Score: 8    End of Session Equipment Utilized During Treatment: Gait belt Activity Tolerance: Patient limited by pain Patient left: in bed;with call bell/phone within reach;with bed alarm set(R heel floating via pillow; L heel floating via towel roll) Nurse Communication: Mobility status;Precautions;Patient requests pain meds(yellow drainage R hip/thigh dressing; red/pink tinged urine (cloudy)) PT Visit Diagnosis: Difficulty in walking, not elsewhere classified (R26.2);History of falling (Z91.81);Pain Pain - Right/Left: Right Pain - part of body: Hip    Time: 1350-1421 PT Time Calculation (min) (ACUTE ONLY): 31 min   Charges:   PT Evaluation $PT Eval Low Complexity: 1 Low PT Treatments $Therapeutic Exercise: 8-22 mins       Leitha Bleak, PT 06/01/19, 2:53 PM [phone]

## 2019-06-01 NOTE — NC FL2 (Signed)
[place] LEVEL OF CARE SCREENING TOOL     IDENTIFICATION  Patient Name: Cynthia Hart Birthdate: 10/05/1927 Sex: female Admission Date (Current Location): 05/30/2019  [place] and [place] Number:  Engineering geologist and Address:  [facility], [address], [place], [place] [address]      Provider Number: [id number]  Attending Physician Name and Address:  Bettey Costa, MD  Relative Name and Phone Number:  [name] sister [phone]    Current Level of Care: Hospital Recommended Level of Care: [place] Prior Approval Number:    Date Approved/Denied:   PASRR Number: [id number]  Discharge Plan: SNF    Current Diagnoses: Patient Active Problem List   Diagnosis Date Noted  . Ileus ([place]) 05/31/2019  . Urinary retention 05/30/2019  . Hip fracture ([place]) 05/22/2019  . Pre-operative cardiovascular examination 09/05/2018  . 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 Moderate to severe mitral regurgitation 09/05/2018  . Pulmonary hypertension, unspecified ([place]) 09/05/2018  . Postoperative examination 08/14/2018  . 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 Heart murmur 08/11/2018  . Dysphagia 08/11/2018    Orientation RESPIRATION BLADDER Height & Weight     Self, Situation  Normal Continent Weight: 37.2 kg Height:  5\' 9"  (175.3 cm)  BEHAVIORAL SYMPTOMS/MOOD NEUROLOGICAL BOWEL NUTRITION STATUS      Continent Diet  AMBULATORY STATUS COMMUNICATION OF NEEDS Skin   Extensive Assist Verbally Normal                       Personal Care Assistance Level of Assistance  Dressing, Bathing Bathing Assistance: Limited assistance Feeding assistance: Limited assistance Dressing Assistance: Limited assistance     Functional Limitations Info  Sight, Hearing, Speech Sight Info: Adequate Hearing Info: Adequate Speech Info: Adequate    SPECIAL CARE FACTORS FREQUENCY  PT (By licensed PT)     PT Frequency: 5 times  per week              Contractures Contractures Info: Not present    Additional Factors Info  Code Status Code Status Info: Full Allergies Info: Alendronate, Codeine, Ibandronic Acid, Penicillins           Current Medications (06/01/2019):  This is the current hospital active medication list Current Facility-Administered Medications  Medication Dose Route Frequency Provider Last Rate Last Dose  . acetaminophen (TYLENOL) tablet 650 mg  650 mg Oral Q6H PRN Henreitta Leber, MD   650 mg at 05/31/19 2030   Or  . acetaminophen (TYLENOL) suppository 650 mg  650 mg Rectal Q6H PRN Henreitta Leber, MD      . bisacodyl (DULCOLAX) suppository 10 mg  10 mg Rectal Once Sudini, Alveta Heimlich, MD      . diphenhydrAMINE (BENADRYL) capsule 25 mg  25 mg Oral Q6H PRN Mansy, Jan A, MD   25 mg at 06/01/19 0310  . donepezil (ARICEPT) tablet 10 mg  10 mg Oral QHS Henreitta Leber, MD   10 mg at 05/31/19 2150  . enoxaparin (LOVENOX) injection 30 mg  30 mg Subcutaneous Q24H Lu Duffel, RPH   30 mg at 06/01/19 1027  . feeding supplement (ENSURE ENLIVE) (ENSURE ENLIVE) liquid 237 mL  237 mL Oral BID BM Mody, Sital, MD   237 mL at 06/01/19 1009  . fluticasone (FLONASE) 50 MCG/ACT nasal spray 1 spray  1 spray Each Nare Daily Henreitta Leber, MD   1 spray at 06/01/19 1203  . lactulose (CHRONULAC) 10 GM/15ML solution  30 g  30 g Oral BID PRN Henreitta Leber, MD      . multivitamin with minerals tablet 1 tablet  1 tablet Oral Daily Bettey Costa, MD   1 tablet at 06/01/19 1002  . ondansetron (ZOFRAN) tablet 4 mg  4 mg Oral Q6H PRN Henreitta Leber, MD       Or  . ondansetron (ZOFRAN) injection 4 mg  4 mg Intravenous Q6H PRN Henreitta Leber, MD      . pantoprazole (PROTONIX) EC tablet 40 mg  40 mg Oral Daily Henreitta Leber, MD   40 mg at 06/01/19 1003  . polyethylene glycol (MIRALAX / GLYCOLAX) packet 17 g  17 g Oral Daily Henreitta Leber, MD   17 g at 06/01/19 1002  . rOPINIRole (REQUIP) tablet 0.25 mg   0.25 mg Oral QHS Henreitta Leber, MD   0.25 mg at 05/31/19 2150  . tamsulosin (FLOMAX) capsule 0.4 mg  0.4 mg Oral Daily Henreitta Leber, MD   0.4 mg at 06/01/19 1002  . Vitamin D (Ergocalciferol) (DRISDOL) capsule 50,000 Units  50,000 Units Oral Q30 days Henreitta Leber, MD   50,000 Units at 06/01/19 1002     Discharge Medications: Please see discharge summary for a list of discharge medications.  Relevant Imaging Results:  Relevant Lab Results:   Additional Information [id number] in [place] must [phone]  Su Hilt, RN

## 2019-06-01 NOTE — Progress Notes (Signed)
[place] [facility] [place] NAME: Cynthia Hart    MR#:  [id number]  DATE OF BIRTH:  [birth year]  SUBJECTIVE:  Feeling weak hungry Had smal BM yesterday  REVIEW OF SYSTEMS:    Review of Systems  Constitutional: Negative for fever, chills weight loss HENT: Negative for ear pain, nosebleeds, congestion, facial swelling, rhinorrhea, neck pain, neck stiffness and ear discharge.   Respiratory: Negative for cough, shortness of breath, wheezing  Cardiovascular: Negative for chest pain, palpitations and leg swelling.  Gastrointestinal: Negative for heartburn, abdominal pain, vomiting, diarrhea or consitpation Genitourinary: Negative for dysuria, urgency, frequency, hematuria Musculoskeletal: Negative for back pain or joint pain Neurological: Negative for dizziness, seizures, syncope, focal weakness,  numbness and headaches.  Hematological: Does not bruise/bleed easily.  Psychiatric/Behavioral: Negative for hallucinations, confusion, dysphoric mood    Tolerating Diet: clears      DRUG ALLERGIES:   Allergies  Allergen Reactions  . Alendronate Other (See Comments)    From Fosamax-Stomach Pain  . Codeine     Nausea   . Ibandronic Acid Other (See Comments)    From Boniva-Stomach Pain  . Penicillins     Nausea,rash and vomiting     VITALS:  Blood pressure (!) 106/52, pulse 74, temperature 98.9 F (37.2 C), temperature source Oral, resp. rate 16, height 5\' 9"  (1.753 m), weight 37.2 kg, SpO2 100 %.  PHYSICAL EXAMINATION:  Constitutional: Appears well-developed and well-nourished. No distress. HENT: Normocephalic. [place] Kitchen Oropharynx is clear and moist.  Eyes: Conjunctivae and EOM are normal. PERRLA, no scleral icterus.  Neck: Normal ROM. Neck supple. No JVD. No tracheal deviation. CVS: RRR, S1/S2 +, no murmurs, no gallops, no carotid bruit.  Pulmonary: Effort and breath sounds normal, no stridor, rhonchi, wheezes, rales.  Abdominal: Abdomen  still slightly distended with good bowel sounds no tenderness or rebound.    Musculoskeletal: Normal range of motion. No edema and no tenderness.  Neuro: Alert. CN 2-12 grossly intact. No focal deficits. Skin: Skin is warm and dry. No rash noted. Psychiatric: Normal mood and affect.      LABORATORY PANEL:   CBC Recent Labs  Lab 05/31/19 0611  WBC 7.2  HGB 7.5*  HCT 23.5*  PLT 394   ------------------------------------------------------------------------------------------------------------------  Chemistries  Recent Labs  Lab 05/30/19 1518 05/31/19 0611  NA 130* 133*  K 3.8 3.3*  CL 96* 101  CO2 24 27  GLUCOSE 108* 91  BUN 22 15  CREATININE 0.46 0.46  CALCIUM 8.6* 8.2*  AST 30  --   ALT 24  --   ALKPHOS 69  --   BILITOT 1.2  --    ------------------------------------------------------------------------------------------------------------------  Cardiac Enzymes No results for input(s): TROPONINI in the last 168 hours. ------------------------------------------------------------------------------------------------------------------  RADIOLOGY:  Dg Abd 1 View  Result Date: 06/01/2019 CLINICAL DATA:  Diffuse abdominal pain. EXAM: ABDOMEN - 1 VIEW COMPARISON:  CT abdomen and pelvis 05/30/2019. Single-view of the abdomen 05/31/2019. FINDINGS: Diffuse gaseous distention of the colon is mildly improved since yesterday. Contrast material in the ascending and descending colon on yesterday's examination is now in the rectosigmoid colon. No new abnormality. IMPRESSION: Mild improvement in gaseous distention of the colon. No new abnormality. Electronically Signed   By: Inge Rise M.D.   On: 06/01/2019 07:26   Dg Abd 1 View  Result Date: 05/31/2019 CLINICAL DATA:  Ileus.  Abdominal pain. EXAM: ABDOMEN - 1 VIEW COMPARISON:  CT scan May 30, 2019. FINDINGS: No significant small bowel dilatation is noted.  However, distended air-filled colon is noted which contains residual  contrast, which may represent ileus. Status post cholecystectomy. IMPRESSION: Distended air-filled colon is noted which contains residual contrast, concerning for ileus. Electronically Signed   By: Marijo Conception M.D.   On: 05/31/2019 08:52   Ct Abdomen Pelvis W Contrast  Result Date: 05/30/2019 CLINICAL DATA:  Abdominal pain and distension. Recent right hip fracture repair. EXAM: CT ABDOMEN AND PELVIS WITH CONTRAST TECHNIQUE: Multidetector CT imaging of the abdomen and pelvis was performed using the standard protocol following bolus administration of intravenous contrast. CONTRAST:  48mL OMNIPAQUE IOHEXOL 300 MG/ML SOLN, 57mL OMNIPAQUE IOHEXOL 240 MG/ML SOLN COMPARISON:  None. FINDINGS: Lower chest: Small dependent bilateral pleural effusions with passive atelectasis at the dependent lung bases. Mildly patulous lower thoracic esophagus with layering oral contrast. A few scattered solid pulmonary nodules at the right lung base, largest 5 mm (series 5/image 5). Calcified granulomatous right infrahilar nodes. Hepatobiliary: Scattered calcified liver granulomas. Normal liver size. No liver mass. Cholecystectomy. No biliary ductal dilatation. Pancreas: Normal, with no mass or duct dilation. Spleen: Calcified splenic granulomas. Normal size spleen. No splenic mass. Adrenals/Urinary Tract: Normal adrenals. Simple 2.6 cm lower left renal cyst. Scattered small simple parapelvic left renal cysts. Additional scattered subcentimeter hypodense renal cortical lesions in both kidneys, too small to characterize, requiring no follow-up. No hydronephrosis. Prominently distended and otherwise normal bladder. Bladder volume approximately 15.6 x 13.5 x 13.7 cm (volume = 1510 cm^3). Stomach/Bowel: Normal non-distended stomach. Normal caliber small bowel with no small bowel wall thickening. Normal appendix. There is moderate diffuse distention of colon and rectum with colorectal air-fluid levels. No large bowel wall thickening,  diverticulosis or significant pericolonic fat stranding. Vascular/Lymphatic: Atherosclerotic nonaneurysmal abdominal aorta. Patent portal, splenic, hepatic and renal veins. No pathologically enlarged lymph nodes in the abdomen or pelvis. Reproductive: Grossly normal uterus.  No adnexal mass. Other: No pneumoperitoneum, ascites or focal fluid collection. Musculoskeletal: No aggressive appearing focal osseous lesions. Mild anasarca. Partially visualized fixation hardware in the proximal right humerus transfixing a comminuted intertrochanteric right proximal femur fracture. Marked lower lumbar spondylosis. IMPRESSION: 1. Prominently distended bladder measuring 1510 cubic cm in volume. Please correlate clinically to exclude bladder outlet obstruction. No hydronephrosis. 2. Moderate diffuse colorectal distention with associated colorectal air-fluid levels. Findings likely represent adynamic ileus and malabsorptive state. No evidence of bowel obstruction. No bowel wall thickening. 3. Small dependent bilateral pleural effusions.  Mild anasarca. 4. Right lung base solid pulmonary nodules, largest 5 mm. No follow-up needed if patient is low-risk (and has no known or suspected primary neoplasm). Non-contrast chest CT can be considered in 12 months if patient is high-risk. This recommendation follows the consensus statement: Guidelines for Management of Incidental Pulmonary Nodules Detected on CT Images:From the Fleischner Society 2017; published online before print (10.1148/radiol.7425956387). Electronically Signed   By: Ilona Sorrel M.D.   On: 05/30/2019 17:41     ASSESSMENT AND PLAN:    85 year old female with history of diabetes and essential hypertension with recent discharge from the hospital after suffering right hip fracture status post ORIF who presented to the hospital due to abdominal pain and distention.  1.  Abdominal pain with distention: This is due to urinary retention along with ileus: Patient's  abdominal pain has improved.  KUB today she still shows some moderate gaseous distention. Patient will continue with aggressive bowel regimen. KUB ordered for tomorrow  2.  Urinary retention: Patient is status post Foley catheter placement She will need Foley at discharge.  3.  Severe malnutrition in context of circumstance: Continue management as per dietary recommendations.   4.  Recent mechanical fall and right hip fracture status post ORIF: Patient will continue with Lovenox for DVT prophylaxis and physical therapy.  5.  Dementia: Continue Aricept  6.  Hypokalemia: Replete  She will need outpatient palliative care service at discharge. Management plans discussed with the patient and she is in agreement.  CODE STATUS: full  TOTAL TIME TAKING CARE OF THIS PATIENT: 27 minutes.     POSSIBLE D/C tomorrow, DEPENDING ON CLINICAL CONDITION.   Bettey Costa M.D on 06/01/2019 at 11:18 AM  Between 7am to 6pm - Pager - [phone] After 6pm go to [email or web address] - password [email or web address] [place] [facility]  Office  [phone]  CC: Primary care physician; Nicholos Johns, MD  Note: This dictation was prepared with Dragon dictation along with smaller phrase technology. Any transcriptional errors that result from this process are unintentional.

## 2019-06-01 NOTE — Plan of Care (Signed)

## 2019-06-01 NOTE — Progress Notes (Signed)
Initial Nutrition Assessment  DOCUMENTATION CODES:   Severe malnutrition in context of social or environmental circumstances, Underweight  INTERVENTION:  Provide Ensure Enlive po BID, each supplement provides 350 kcal and 20 grams of protein.  Provide daily MVI.  NUTRITION DIAGNOSIS:   Severe Malnutrition related to social / environmental circumstances(decreased appetite, inadequate oral intake) as evidenced by severe fat depletion, severe muscle depletion.  GOAL:   Patient will meet greater than or equal to 90% of their needs  MONITOR:   PO intake, Supplement acceptance, Labs, Weight trends, Skin, I & O's  REASON FOR ASSESSMENT:   Other (Comment)(Low BMI)    ASSESSMENT:   85 year old female with PMHx of dementia, OA, HTN, vitamin D deficiency, OP, DM recent fall and right hip fracture s/p right intertrochantric IM nail on 6/26 who is now admitted with ileus, urinary retention.   Met with patient at bedside. She reports her appetite and intake have been decreased for a while now. She is unable to describe her typical intake. Here she reports she is trying to do the best she can with eating. Her diet was advanced to regular yesterday morning. This morning she ordered a muffin, applesauce, and milk but she had not eaten any breakfast yet at time of RD assessment. Patient is amenable to drinking Ensure to help meet calorie/protein needs.  Patient reports her UBW was 140 lbs when she was more active. She had lost down to 72 lbs when she got a bad case of diverticulitis and had then regained some weight. Per chart she is now 37.2 kg (82 lbs).  Medications reviewed and include: pantoprazole, Miralax 17 grams daily, FLEET enema once today, Flomax, Ultram, vitamin D 50000 units every 30 days.  Labs reviewed: Sodium 133, Potassium 3.3.  NUTRITION - FOCUSED PHYSICAL EXAM:    Most Recent Value  Orbital Region  Severe depletion  Upper Arm Region  Severe depletion  Thoracic and Lumbar  Region  Severe depletion  Buccal Region  Severe depletion  Temple Region  Severe depletion  Clavicle Bone Region  Severe depletion  Clavicle and Acromion Bone Region  Severe depletion  Scapular Bone Region  Severe depletion  Dorsal Hand  Severe depletion  Patellar Region  Severe depletion  Anterior Thigh Region  Severe depletion  Posterior Calf Region  Severe depletion  Edema (RD Assessment)  -- [non-pitting to right lower extremity]  Hair  Reviewed  Eyes  Reviewed  Mouth  Reviewed  Skin  Reviewed  Nails  Reviewed     Diet Order:   Diet Order            Diet regular Room service appropriate? Yes; Fluid consistency: Thin  Diet effective now             EDUCATION NEEDS:   No education needs have been identified at this time  Skin:  Skin Assessment: Skin Integrity Issues:(closed incision right hip)  Last BM:  06/01/2019 - large type 7  Height:   Ht Readings from Last 1 Encounters:  05/30/19 _0  (1.753 m)   Weight:   Wt Readings from Last 1 Encounters:  05/30/19 37.2 kg   Ideal Body Weight:  65.9 kg  BMI:  Body mass index is 12.11 kg/m.  Estimated Nutritional Needs:   Kcal:  1200-1400  Protein:  60-70 grams  Fluid:  1.2-1.4 L/day  Willey Blade, MS, RD, LDN Office: [phone] Pager: [phone] After Hours/Weekend Pager: [phone]

## 2019-06-01 NOTE — TOC Progression Note (Signed)
Transition of Care [facility]) - Progression Note    Patient Details  Name: Cynthia Hart MRN: [id number] Date of Birth: 1927/01/23  Transition of Care [facility]) CM/SW [place], RN Phone Number: 06/01/2019, 3:32 PM  Clinical Narrative:    Damaris Schooner with [name] the daughter at [phone], she has agreed that the patient will return to [facility] at DC, I called and spoke to [place] at [facility] and she has accepted the patient wand will start insurance authorization.          Expected Discharge Plan and Services                                                 Social Determinants of Health (SDOH) Interventions    Readmission Risk Interventions No flowsheet data found.

## 2019-06-02 ENCOUNTER — Inpatient Hospital Stay: Payer: Medicare Other

## 2019-06-02 LAB — BASIC METABOLIC PANEL
Anion gap: 8 (ref 5–15)
BUN: 15 mg/dL (ref 8–23)
CO2: 30 mmol/L (ref 22–32)
Calcium: 8 mg/dL — ABNORMAL LOW (ref 8.9–10.3)
Chloride: 99 mmol/L (ref 98–111)
Creatinine, Ser: 0.42 mg/dL — ABNORMAL LOW (ref 0.44–1.00)
GFR calc Af Amer: >60 mL/min (ref >60)
GFR calc non Af Amer: >60 mL/min (ref >60)
Glucose, Bld: 103 mg/dL — ABNORMAL HIGH (ref 70–99)
Potassium: 2.7 mmol/L — CL (ref 3.5–5.1)
Sodium: 137 mmol/L (ref 135–145)

## 2019-06-02 LAB — MAGNESIUM: Magnesium: 1.9 mg/dL (ref 1.7–2.4)

## 2019-06-02 LAB — POTASSIUM: Potassium: 4.2 mmol/L (ref 3.5–5.1)

## 2019-06-02 MED ORDER — ENSURE ENLIVE PO LIQD: 237.0000 mL | Freq: Two times a day (BID) | ORAL | 12 refills | Status: DC

## 2019-06-02 MED ORDER — LOPERAMIDE HCL 2 MG PO CAPS: 2.0000 mg | ORAL_CAPSULE | Freq: Four times a day (QID) | ORAL | 0 refills | Status: DC | PRN

## 2019-06-02 MED ORDER — POTASSIUM CHLORIDE 20 MEQ PO PACK
40.0000 meq | PACK | Freq: Once | ORAL | Status: AC
  Administered 2019-06-02: 40 meq via ORAL
  Filled 2019-06-02: qty 2

## 2019-06-02 MED ORDER — TAMSULOSIN HCL 0.4 MG PO CAPS: 0.4000 mg | ORAL_CAPSULE | Freq: Every day | ORAL | 0 refills | Status: DC

## 2019-06-02 MED ORDER — LOPERAMIDE HCL 2 MG PO CAPS
2.0000 mg | ORAL_CAPSULE | Freq: Four times a day (QID) | ORAL | Status: DC | PRN
  Administered 2019-06-02: 2 mg via ORAL
  Filled 2019-06-02 (×2): qty 1

## 2019-06-02 MED ORDER — ENOXAPARIN SODIUM 30 MG/0.3ML ~~LOC~~ SOLN: 30.0000 mg | SUBCUTANEOUS | 0 refills | Status: DC

## 2019-06-02 MED ORDER — POTASSIUM CHLORIDE 20 MEQ PO PACK
40.0000 meq | PACK | Freq: Every day | ORAL | Status: AC
  Administered 2019-06-02: 40 meq via ORAL
  Filled 2019-06-02: qty 2

## 2019-06-02 NOTE — Discharge Summary (Signed)
[facility] [place] NAME: Cynthia Hart    MR#:  [id number]  DATE OF BIRTH:  01-06-1927  DATE OF ADMISSION:  05/30/2019 ADMITTING PHYSICIAN: Henreitta Leber, MD  DATE OF DISCHARGE: 06/02/2019  PRIMARY CARE PHYSICIAN: Harlow Ohms, MD    ADMISSION DIAGNOSIS:  Urinary retention [R33.9] Ileus ([place]) [K56.7]  DISCHARGE DIAGNOSIS:  Active Problems:   Urinary retention   Ileus ([place])   SECONDARY DIAGNOSIS:   Past Medical History:  Diagnosis Date  . Diabetes ([place])   . Hypertension   . Osteoarthritis   . Osteoporosis   . Postmenopausal   . Tumor   . Vitamin D deficiency     HOSPITAL COURSE:  85 year old female with history of diabetes and essential hypertension with recent discharge from the hospital after suffering right hip fracture status post ORIF who presented to the hospital due to abdominal pain and distention.  1.  Abdominal pain with distention: This was due to urinary retention along with ileus. KUB is resolved.  She is now having diarrhea due to laxatives.  She may be able to use Imodium as needed for her diarrhea.   2.  Urinary retention: Patient is status post Foley catheter placement She will need Foley at discharge. She needs a voiding trial in 1 week.  She is also on Flomax.  3.  Severe malnutrition in context of circumstance: She will continue supplements as ordered by dietary.   4.  Recent mechanical fall and right hip fracture status post ORIF: Patient will continue with Lovenox for DVT prophylaxis and physical therapy.  5.  Dementia: Continue Aricept  6.  Hypokalemia: Was repleted prior to discharge and is related to her ileus and diarrhea. She needs outpatient palliative care services.   DISCHARGE CONDITIONS AND DIET:   Stable Regular diet  CONSULTS OBTAINED:  Treatment Team:  Hessie Knows, MD  DRUG ALLERGIES:   Allergies  Allergen Reactions  . Alendronate Other (See Comments)    From  Fosamax-Stomach Pain  . Codeine     Nausea   . Ibandronic Acid Other (See Comments)    From Boniva-Stomach Pain  . Penicillins     Nausea,rash and vomiting     DISCHARGE MEDICATIONS:   Allergies as of 06/02/2019      Reactions   Alendronate Other (See Comments)   From Fosamax-Stomach Pain   Codeine    Nausea    Ibandronic Acid Other (See Comments)   From Boniva-Stomach Pain   Penicillins    Nausea,rash and vomiting      Medication List    TAKE these medications   acetaminophen 325 MG tablet Commonly known as: TYLENOL Take 2 tablets (650 mg total) by mouth every 6 (six) hours as needed for mild pain (or Fever >/= 101).   donepezil 10 MG tablet Commonly known as: ARICEPT Take 10 mg by mouth at bedtime.   enoxaparin 30 MG/0.3ML injection Commonly known as: LOVENOX Inject 0.3 mLs (30 mg total) into the skin daily for 4 days. Start taking on: June 03, 2019 What changed:   medication strength  how much to take   feeding supplement (ENSURE ENLIVE) Liqd Take 237 mLs by mouth 2 (two) times daily between meals.   fluticasone 50 MCG/ACT nasal spray Commonly known as: FLONASE Place 1 spray into both nostrils daily.   loperamide 2 MG capsule Commonly known as: IMODIUM Take 1 capsule (2 mg total) by mouth every 6 (six) hours as needed for  diarrhea or loose stools.   pantoprazole 40 MG tablet Commonly known as: Protonix Take 1 tablet (40 mg total) by mouth daily.   rOPINIRole 0.25 MG tablet Commonly known as: REQUIP Take 1 tablet by mouth at bedtime.   tamsulosin 0.4 MG Caps capsule Commonly known as: FLOMAX Take 1 capsule (0.4 mg total) by mouth daily. Start taking on: June 03, 2019   traMADol 50 MG tablet Commonly known as: ULTRAM Take 50 mg by mouth every 6 (six) hours as needed.   Vitamin D3 1.25 MG (50000 UT) Caps Take 1 capsule by mouth every 30 (thirty) days.         Today   CHIEF COMPLAINT:  Patient with diarrhea no abdominal pain no abdominal  distention   VITAL SIGNS:  Blood pressure (!) 107/57, pulse 73, temperature 98.5 F (36.9 C), resp. rate 17, height 5\' 9"  (1.753 m), weight 37.2 kg, SpO2 98 %.   REVIEW OF SYSTEMS:  Review of Systems  Constitutional: Positive for malaise/fatigue. Negative for chills and fever.  HENT: Negative.  Negative for ear discharge, ear pain, hearing loss, nosebleeds and sore throat.   Eyes: Negative.  Negative for blurred vision and pain.  Respiratory: Negative.  Negative for cough, hemoptysis, shortness of breath and wheezing.   Cardiovascular: Negative.  Negative for chest pain, palpitations and leg swelling.  Gastrointestinal: Positive for diarrhea. Negative for abdominal pain, blood in stool, nausea and vomiting.  Genitourinary: Negative.  Negative for dysuria.  Musculoskeletal: Negative.  Negative for back pain.  Skin: Negative.   Neurological: Negative for dizziness, tremors, speech change, focal weakness, seizures and headaches.  Endo/Heme/Allergies: Negative.  Does not bruise/bleed easily.  Psychiatric/Behavioral: Negative.  Negative for depression, hallucinations and suicidal ideas.     PHYSICAL EXAMINATION:  GENERAL:  85 y.o.-year-old patient lying in the bed with no acute distress.  Generalized weakness NECK:  Supple, no jugular venous distention. No thyroid enlargement, no tenderness.  LUNGS: Normal breath sounds bilaterally, no wheezing, rales,rhonchi  No use of accessory muscles of respiration.  CARDIOVASCULAR: S1, S2 normal. No murmurs, rubs, or gallops.  ABDOMEN: Soft, non-tender, non-distended. Bowel sounds present. No organomegaly or mass.  EXTREMITIES: No pedal edema, cyanosis, or clubbing.  PSYCHIATRIC: The patient is alert and oriented x 3.  SKIN: No obvious rash, lesion, or ulcer.   DATA REVIEW:   CBC Recent Labs  Lab 05/31/19 0611  WBC 7.2  HGB 7.5*  HCT 23.5*  PLT 394    Chemistries  Recent Labs  Lab 05/30/19 1518  06/02/19 0525  NA 130*   < > 137   K 3.8   < > 2.7*  CL 96*   < > 99  CO2 24   < > 30  GLUCOSE 108*   < > 103*  BUN 22   < > 15  CREATININE 0.46   < > 0.42*  CALCIUM 8.6*   < > 8.0*  MG  --   --  1.9  AST 30  --   --   ALT 24  --   --   ALKPHOS 69  --   --   BILITOT 1.2  --   --    < > = values in this interval not displayed.    Cardiac Enzymes No results for input(s): TROPONINI in the last 168 hours.  Microbiology Results  @MICRORSLT48 @  RADIOLOGY:  Dg Abd 1 View  Result Date: 06/02/2019 CLINICAL DATA:  Ileus EXAM: ABDOMEN - 1 VIEW COMPARISON:  06/01/2019;  05/31/2019; CT abdomen pelvis-05/30/2019; chest radiograph-05/22/2019 FINDINGS: Decreased gaseous distention of the colon and small bowel. The sigmoid colon remains dilated and patulous measuring approximately 6.5 cm in diameter, previously, 8 cm. No supine evidence of pneumoperitoneum. No pneumatosis or portal venous gas. Limited visualization of the lower thorax demonstrates small/trace bilateral effusions with associated bibasilar heterogeneous/consolidative opacities. Partially calcified right infrahilar lymph nodes. Atherosclerotic plaque within the abdominal aorta. Moderate scoliotic curvature of the thoracolumbar spine. Post right femoral IM nailing. Post cholecystectomy. IMPRESSION: Suspected improving ileus. Electronically Signed   By: Sandi Mariscal M.D.   On: 06/02/2019 08:31   Dg Abd 1 View  Result Date: 06/01/2019 CLINICAL DATA:  Diffuse abdominal pain. EXAM: ABDOMEN - 1 VIEW COMPARISON:  CT abdomen and pelvis 05/30/2019. Single-view of the abdomen 05/31/2019. FINDINGS: Diffuse gaseous distention of the colon is mildly improved since yesterday. Contrast material in the ascending and descending colon on yesterday's examination is now in the rectosigmoid colon. No new abnormality. IMPRESSION: Mild improvement in gaseous distention of the colon. No new abnormality. Electronically Signed   By: Inge Rise M.D.   On: 06/01/2019 07:26      Allergies as of  06/02/2019      Reactions   Alendronate Other (See Comments)   From Fosamax-Stomach Pain   Codeine    Nausea    Ibandronic Acid Other (See Comments)   From Boniva-Stomach Pain   Penicillins    Nausea,rash and vomiting      Medication List    TAKE these medications   acetaminophen 325 MG tablet Commonly known as: TYLENOL Take 2 tablets (650 mg total) by mouth every 6 (six) hours as needed for mild pain (or Fever >/= 101).   donepezil 10 MG tablet Commonly known as: ARICEPT Take 10 mg by mouth at bedtime.   enoxaparin 30 MG/0.3ML injection Commonly known as: LOVENOX Inject 0.3 mLs (30 mg total) into the skin daily for 4 days. Start taking on: June 03, 2019 What changed:   medication strength  how much to take   feeding supplement (ENSURE ENLIVE) Liqd Take 237 mLs by mouth 2 (two) times daily between meals.   fluticasone 50 MCG/ACT nasal spray Commonly known as: FLONASE Place 1 spray into both nostrils daily.   loperamide 2 MG capsule Commonly known as: IMODIUM Take 1 capsule (2 mg total) by mouth every 6 (six) hours as needed for diarrhea or loose stools.   pantoprazole 40 MG tablet Commonly known as: Protonix Take 1 tablet (40 mg total) by mouth daily.   rOPINIRole 0.25 MG tablet Commonly known as: REQUIP Take 1 tablet by mouth at bedtime.   tamsulosin 0.4 MG Caps capsule Commonly known as: FLOMAX Take 1 capsule (0.4 mg total) by mouth daily. Start taking on: June 03, 2019   traMADol 50 MG tablet Commonly known as: ULTRAM Take 50 mg by mouth every 6 (six) hours as needed.   Vitamin D3 1.25 MG (50000 UT) Caps Take 1 capsule by mouth every 30 (thirty) days.          Management plans discussed with the patient and she is in agreement. Stable for discharge   Patient should follow up with pcp  CODE STATUS:     Code Status Orders  (From admission, onward)         Start     Ordered   05/30/19 2141  Full code  Continuous     05/30/19 2140         Code Status  History    Date Active Date Inactive Code Status Order ID Comments User Context   05/22/2019 0317 05/25/2019 1732 Full Code [id number]  Harrie Foreman, MD ED   Advance Care Planning Activity      TOTAL TIME TAKING CARE OF THIS PATIENT: 38 minutes.    Note: This dictation was prepared with Dragon dictation along with smaller phrase technology. Any transcriptional errors that result from this process are unintentional.  Bettey Costa M.D on 06/02/2019 at 12:00 PM  Between 7am to 6pm - Pager - [phone] After 6pm go to [email or web address] - password [email or web address] [facility]  Office  [phone]  CC: Primary care physician; Harlow Ohms, MD

## 2019-06-02 NOTE — Progress Notes (Signed)
PT Cancellation Note  Patient Details Name: Cynthia Hart MRN: [id number] DOB: Apr 16, 1927   Cancelled Treatment:    Reason Eval/Treat Not Completed: Medical issues which prohibited therapy.  Chart reviewed.  Pt's potassium noted to be 2.7 today.  Per PT guidelines for low potassium, PT currently contra-indicated.  Will hold PT at this time and re-attempt PT treatment at a later date/time as medically appropriate.  Leitha Bleak, PT 06/02/19, 8:21 AM [phone]

## 2019-06-02 NOTE — Progress Notes (Signed)
Pt D/C'd to [facility] via EMS. There was a note on the chart to send pt with glasses and cell phone, but these items were not present in pts room. These items were not logged in upon admission. So, there was no record of pt having these items throughout hospital stay.

## 2019-06-02 NOTE — Progress Notes (Signed)
Dr. Sidney Ace paged for critical Lab result K level =2.7 for Cynthia Hart, Will continue to monitor pt. Closely.

## 2019-06-02 NOTE — Progress Notes (Signed)
Pharmacy Electrolyte Monitoring Consult:  Pharmacy consulted to assist in monitoring and replacing electrolytes in this 85 y.o. female admitted on 05/30/2019 with Abdominal Pain ileus  Labs:  Sodium (mmol/L)  Date Value  06/02/2019 137   Potassium (mmol/L)  Date Value  06/02/2019 2.7 (LL)   Magnesium (mg/dL)  Date Value  06/02/2019 1.9   Calcium (mg/dL)  Date Value  06/02/2019 8.0 (L)   Albumin (g/dL)  Date Value  05/30/2019 2.9 (L)    Assessment/Plan: K 2.7  Mag 1.9  Scr 0.42 KCL 40 meq ordered/given at 0745 and KCL 40 meq PO packet ordered by MD for 1000. Will recheck K at 1800 F/u with am labs  Euline Kimbler A 06/02/2019 8:51 AM

## 2019-06-02 NOTE — TOC Transition Note (Signed)
Transition of Care [facility]) - CM/SW Discharge Note   Patient Details  Name: Cynthia Hart MRN: [id number] Date of Birth: 02/03/1927  [facility] CM/SW Contact:  Su Hilt, RN Phone Number: 06/02/2019, 12:07 PM   Clinical Narrative:    Patient to go to [facility] Room 32 B  Notified daughter [name] and notified of the patient going to [facility] today She wanted me to let the nurse know the patient will need to make sure and take her cell phone, glasses and mail from there room. I let her know I would notify the nurse to make sure it gets put in her bag The bedside nurse is to call [phone] for report and EMS when ready to tranpsort    Final next level of care: Skilled Nursing Facility Barriers to Discharge: Barriers Resolved   Patient Goals and CMS Choice        Discharge Placement                       Discharge Plan and Services                                     Social Determinants of Health (SDOH) Interventions     Readmission Risk Interventions No flowsheet data found.

## 2019-12-14 DIAGNOSIS — J9620 Acute and chronic respiratory failure, unspecified whether with hypoxia or hypercapnia: Secondary | ICD-10-CM | POA: Diagnosis not present

## 2019-12-14 DIAGNOSIS — J9611 Chronic respiratory failure with hypoxia: Secondary | ICD-10-CM | POA: Diagnosis not present

## 2019-12-14 DIAGNOSIS — J449 Chronic obstructive pulmonary disease, unspecified: Secondary | ICD-10-CM | POA: Diagnosis not present

## 2019-12-14 DIAGNOSIS — S72141A Displaced intertrochanteric fracture of right femur, initial encounter for closed fracture: Secondary | ICD-10-CM | POA: Diagnosis not present

## 2020-01-14 DIAGNOSIS — J9611 Chronic respiratory failure with hypoxia: Secondary | ICD-10-CM | POA: Diagnosis not present

## 2020-01-14 DIAGNOSIS — J449 Chronic obstructive pulmonary disease, unspecified: Secondary | ICD-10-CM | POA: Diagnosis not present

## 2020-01-14 DIAGNOSIS — J9620 Acute and chronic respiratory failure, unspecified whether with hypoxia or hypercapnia: Secondary | ICD-10-CM | POA: Diagnosis not present

## 2020-01-14 DIAGNOSIS — S72141A Displaced intertrochanteric fracture of right femur, initial encounter for closed fracture: Secondary | ICD-10-CM | POA: Diagnosis not present

## 2020-01-18 DIAGNOSIS — R634 Abnormal weight loss: Secondary | ICD-10-CM | POA: Diagnosis not present

## 2020-01-18 DIAGNOSIS — M25551 Pain in right hip: Secondary | ICD-10-CM | POA: Diagnosis not present

## 2020-01-18 DIAGNOSIS — R251 Tremor, unspecified: Secondary | ICD-10-CM | POA: Diagnosis not present

## 2020-01-18 DIAGNOSIS — R4189 Other symptoms and signs involving cognitive functions and awareness: Secondary | ICD-10-CM | POA: Diagnosis not present

## 2020-01-18 DIAGNOSIS — I5032 Chronic diastolic (congestive) heart failure: Secondary | ICD-10-CM | POA: Diagnosis not present

## 2020-01-18 DIAGNOSIS — L821 Other seborrheic keratosis: Secondary | ICD-10-CM | POA: Diagnosis not present

## 2020-01-18 DIAGNOSIS — M810 Age-related osteoporosis without current pathological fracture: Secondary | ICD-10-CM | POA: Diagnosis not present

## 2020-02-11 DIAGNOSIS — J449 Chronic obstructive pulmonary disease, unspecified: Secondary | ICD-10-CM | POA: Diagnosis not present

## 2020-02-11 DIAGNOSIS — S72141A Displaced intertrochanteric fracture of right femur, initial encounter for closed fracture: Secondary | ICD-10-CM | POA: Diagnosis not present

## 2020-02-11 DIAGNOSIS — J9611 Chronic respiratory failure with hypoxia: Secondary | ICD-10-CM | POA: Diagnosis not present

## 2020-02-11 DIAGNOSIS — J9620 Acute and chronic respiratory failure, unspecified whether with hypoxia or hypercapnia: Secondary | ICD-10-CM | POA: Diagnosis not present

## 2020-02-16 DIAGNOSIS — M62838 Other muscle spasm: Secondary | ICD-10-CM | POA: Diagnosis not present

## 2020-02-16 DIAGNOSIS — S46911A Strain of unspecified muscle, fascia and tendon at shoulder and upper arm level, right arm, initial encounter: Secondary | ICD-10-CM | POA: Diagnosis not present

## 2020-03-25 ENCOUNTER — Encounter: Payer: Self-pay | Admitting: Emergency Medicine

## 2020-03-25 ENCOUNTER — Emergency Department: Payer: Medicare PPO

## 2020-03-25 ENCOUNTER — Emergency Department
Admission: EM | Admit: 2020-03-25 | Discharge: 2020-03-25 | Disposition: A | Discharge status: Home / Self Care | Payer: Medicare PPO | Attending: Emergency Medicine | Admitting: Emergency Medicine

## 2020-03-25 ENCOUNTER — Other Ambulatory Visit: Payer: Self-pay

## 2020-03-25 DIAGNOSIS — Y92129 Unspecified place in nursing home as the place of occurrence of the external cause: Secondary | ICD-10-CM | POA: Insufficient documentation

## 2020-03-25 DIAGNOSIS — S0230XB Fracture of orbital floor, unspecified side, initial encounter for open fracture: Secondary | ICD-10-CM | POA: Insufficient documentation

## 2020-03-25 DIAGNOSIS — S0990XA Unspecified injury of head, initial encounter: Secondary | ICD-10-CM | POA: Diagnosis present

## 2020-03-25 DIAGNOSIS — E119 Type 2 diabetes mellitus without complications: Secondary | ICD-10-CM | POA: Diagnosis not present

## 2020-03-25 DIAGNOSIS — S0101XA Laceration without foreign body of scalp, initial encounter: Secondary | ICD-10-CM | POA: Diagnosis not present

## 2020-03-25 DIAGNOSIS — Y939 Activity, unspecified: Secondary | ICD-10-CM | POA: Insufficient documentation

## 2020-03-25 DIAGNOSIS — W19XXXA Unspecified fall, initial encounter: Secondary | ICD-10-CM | POA: Diagnosis not present

## 2020-03-25 DIAGNOSIS — I10 Essential (primary) hypertension: Secondary | ICD-10-CM | POA: Diagnosis not present

## 2020-03-25 DIAGNOSIS — Y999 Unspecified external cause status: Secondary | ICD-10-CM | POA: Insufficient documentation

## 2020-03-25 LAB — BASIC METABOLIC PANEL
Anion gap: 8 (ref 5–15)
BUN: 14 mg/dL (ref 8–23)
CO2: 29 mmol/L (ref 22–32)
Calcium: 8.8 mg/dL — ABNORMAL LOW (ref 8.9–10.3)
Chloride: 102 mmol/L (ref 98–111)
Creatinine, Ser: 0.57 mg/dL (ref 0.44–1.00)
GFR calc Af Amer: >60 mL/min (ref >60)
GFR calc non Af Amer: >60 mL/min (ref >60)
Glucose, Bld: 111 mg/dL — ABNORMAL HIGH (ref 70–99)
Potassium: 4.1 mmol/L (ref 3.5–5.1)
Sodium: 139 mmol/L (ref 135–145)

## 2020-03-25 LAB — CBC
HCT: 43.4 % (ref 36.0–46.0)
Hemoglobin: 14.1 g/dL (ref 12.0–15.0)
MCH: 32.4 pg (ref 26.0–34.0)
MCHC: 32.5 g/dL (ref 30.0–36.0)
MCV: 99.8 fL (ref 80.0–100.0)
Platelets: 243 10*3/uL (ref 150–400)
RBC: 4.35 MIL/uL (ref 3.87–5.11)
RDW: 13.9 % (ref 11.5–15.5)
WBC: 6.8 10*3/uL (ref 4.0–10.5)
nRBC: 0 % (ref 0.0–0.2)

## 2020-03-25 LAB — URINALYSIS, COMPLETE (UACMP) WITH MICROSCOPIC
Bacteria, UA: NONE SEEN
Bilirubin Urine: NEGATIVE
Glucose, UA: NEGATIVE mg/dL
Ketones, ur: NEGATIVE mg/dL
Leukocytes,Ua: NEGATIVE
Nitrite: NEGATIVE
Protein, ur: 30 mg/dL — AB
Specific Gravity, Urine: 1.015 (ref 1.005–1.030)
Squamous Epithelial / HPF: NONE SEEN (ref 0–5)
pH: 7 (ref 5.0–8.0)

## 2020-03-25 MED ORDER — AZITHROMYCIN 250 MG PO TABS
ORAL_TABLET | ORAL | 0 refills | Status: AC
Start: 2020-03-25 — End: ?

## 2020-03-25 NOTE — ED Notes (Signed)
Pt transported to xray 

## 2020-03-25 NOTE — ED Notes (Signed)
Pt daughter at bedside

## 2020-03-25 NOTE — ED Provider Notes (Signed)
[facility] Emergency Department Provider Note       Time seen: ----------------------------------------- 11:07 AM on 03/25/2020 -----------------------------------------   I have reviewed the triage vital signs and the nursing notes.  HISTORY   Chief Complaint Fall    HPI Cynthia Hart is a 86 y.o. female with a history of diabetes, hypertension who presents to the ED for unwitnessed fall.  Patient presents with a small laceration above her right eye and right eye hematoma noted.  Patient states she normally uses a walker and was using it today but does not member what happened.  Past Medical History:  Diagnosis Date  . Diabetes ([place])   . Hypertension   . Osteoarthritis   . Osteoporosis   . Postmenopausal   . Tumor   . Vitamin D deficiency     Patient Active Problem List   Diagnosis Date Noted  . Ileus ([place]) 05/31/2019  . Urinary retention 05/30/2019  . Hip fracture ([place]) 05/22/2019  . Pre-operative cardiovascular examination 09/05/2018  . 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 Moderate to severe mitral regurgitation 09/05/2018  . Pulmonary hypertension, unspecified ([place]) 09/05/2018  . Postoperative examination 08/14/2018  . 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 Heart murmur 08/11/2018  . Dysphagia 08/11/2018    Past Surgical History:  Procedure Laterality Date  . CATARACT EXTRACTION    . CHOLECYSTECTOMY    . INTRAMEDULLARY (IM) NAIL INTERTROCHANTERIC Right 05/22/2019   Procedure: INTRAMEDULLARY (IM) NAIL INTERTROCHANTRIC;  Surgeon: Hessie Knows, MD;  Location: [facility] ORS;  Service: Orthopedics;  Laterality: Right;    Allergies Alendronate, Codeine, Ibandronic acid, and Penicillins  Social History Social History   Tobacco Use  . Smoking status: Never Smoker  . Smokeless tobacco: Never Used  Substance Use Topics  . Alcohol use: Not Currently  . Drug use: Never    Review of Systems Constitutional: Negative for  fever. Cardiovascular: Negative for chest pain. Respiratory: Negative for shortness of breath. Gastrointestinal: Negative for abdominal pain, vomiting and diarrhea. Musculoskeletal: Positive for left leg pain Skin: Positive for right periorbital laceration Neurological: Positive for headache  All systems negative/normal/unremarkable except as stated in the HPI  ____________________________________________   PHYSICAL EXAM:  VITAL SIGNS: ED Triage Vitals  Enc Vitals Group     BP --      Pulse --      Resp --      Temp 03/25/20 1103 97.7 F (36.5 C)     Temp Source 03/25/20 1103 Axillary     SpO2 --      Weight 03/25/20 1104 110 lb (49.9 kg)     Height 03/25/20 1104 5\' 10"  (1.778 m)     Head Circumference --      Peak Flow --      Pain Score 03/25/20 1103 0     Pain Loc --      Pain Edu? --      Excl. in [place]? --     Constitutional: Alert and oriented. Well appearing and in no distress. Eyes: Conjunctivae are normal. Normal extraocular movements. ENT      Head: Normocephalic, right periorbital laceration is noted, right periorbital ecchymosis      Nose: No congestion/rhinnorhea.      Mouth/Throat: Mucous membranes are moist.      Neck: No stridor. Cardiovascular: Normal rate, regular rhythm. No murmurs, rubs, or gallops. Respiratory: Normal respiratory effort without tachypnea nor retractions. Breath sounds are clear and equal bilaterally. No wheezes/rales/rhonchi. Gastrointestinal: Soft and nontender. Normal bowel sounds  Musculoskeletal: Tenderness of the left thigh Neurologic:  Normal speech and language. No gross focal neurologic deficits are appreciated.  Skin: Right periorbital ecchymosis Psychiatric: Mood and affect are normal. ____________________________________________  EKG: Interpreted by me.  Sinus arrhythmia with a rate of 67 bpm, PVCs, septal infarct, normal QT  ____________________________________________  ED COURSE:  As part of my medical decision  making, I reviewed the following data within the [place] History obtained from family if available, nursing notes, old chart and ekg, as well as notes from prior ED visits. Patient presented for a fall, we will assess with labs and imaging as indicated at this time.   Procedures  Cynthia Hart was evaluated in Emergency Department on 03/25/2020 for the symptoms described in the history of present illness. She was evaluated in the context of the global COVID-19 pandemic, which necessitated consideration that the patient might be at risk for infection with the SARS-CoV-2 virus that causes COVID-19. Institutional protocols and algorithms that pertain to the evaluation of patients at risk for COVID-19 are in a state of rapid change based on information released by regulatory bodies including the CDC and federal and state organizations. These policies and algorithms were followed during the patient's care in the ED.  ____________________________________________   LABS (pertinent positives/negatives)  Labs Reviewed  BASIC METABOLIC PANEL - Abnormal; Notable for the following components:      Result Value   Glucose, Bld 111 (*)    Calcium 8.8 (*)    All other components within normal limits  URINALYSIS, COMPLETE (UACMP) WITH MICROSCOPIC - Abnormal; Notable for the following components:   Color, Urine YELLOW (*)    APPearance CLEAR (*)    Hgb urine dipstick SMALL (*)    Protein, ur 30 (*)    All other components within normal limits  CBC    RADIOLOGY Images were viewed by me  CT head, left femur x-ray IMPRESSION: 1. Suspected fracture of the RIGHT orbital floor, incompletely imaged. Recommend facial bone CT for further characterization. 2. No acute intracranial abnormality. No intracranial mass, hemorrhage or edema. No skull fracture. IMPRESSION: 1. Mildly comminuted RIGHT orbital floor fracture with RIGHT inferior orbital musculature extending to the fracture  site. Fracture fragments are displaced by up to 4 mm 2. RIGHT facial soft tissue swelling. IMPRESSION: 1. No acute fracture or dislocation. 2. Prior ORIF of an intertrochanteric right hip fracture without hardware complication. IMPRESSION: Negative evaluation of the LEFT femur ____________________________________________   DIFFERENTIAL DIAGNOSIS   Fall, contusion, fracture, subdural, laceration, electrolyte abnormality  FINAL ASSESSMENT AND PLAN  Fall, minor head injury, facial laceration, right orbital floor fracture   Plan: The patient had presented for a fall. Patient's labs are unremarkable. Patient's imaging revealed a right orbital floor fracture.  Orbital musculature extends to the fracture site with some fracture fragment displacement.  She does have right maxillary sinus opacification.  I have discussed with ENT.  Currently she does not have signs of entrapment and can look up normally without double vision.  There are no vision changes and there is only minimal pain.  Have advised ice and we will place her on antibiotics with ENT follow-up next week.   Laurence Aly, MD    Note: This note was generated in part or whole with voice recognition software. Voice recognition is usually quite accurate but there are transcription errors that can and very often do occur. I apologize for any typographical errors that were not detected and corrected.  Earleen Newport, MD 03/25/20 [phone]

## 2020-03-25 NOTE — ED Notes (Signed)
Pt transported to CT ?

## 2020-03-25 NOTE — ED Triage Notes (Signed)
Pt presents to ED via [facility] from independent living at [facility] c/o unwitnessed fall. Pt presents with small lac above R eye and R eye hematoma noted. Pt states she normally uses a walker and was using it today but doesn't remember what happened. No blood thinner use.

## 2020-03-25 NOTE — ED Notes (Signed)
This RN at bedside to ambulate pt per MD orders. Pt able to ambulate to restroom with assistance by this RN. MD made aware.

## 2020-03-25 NOTE — ED Provider Notes (Signed)
Patient was able to ambulate with assistance.   Earleen Newport, MD 03/25/20 [phone]

## 2020-03-25 NOTE — ED Notes (Signed)
MD at bedside. 

## 2020-05-27 IMAGING — CR CHEST  1 VIEW
1 series · 1 of 1 positions shown · non-contrast
Comparison: None.

CLINICAL DATA: [AGE] female with right femoral neck fracture.
Preop radiograph.

EXAM:
CHEST  1 VIEW

[dg chest 1 view]
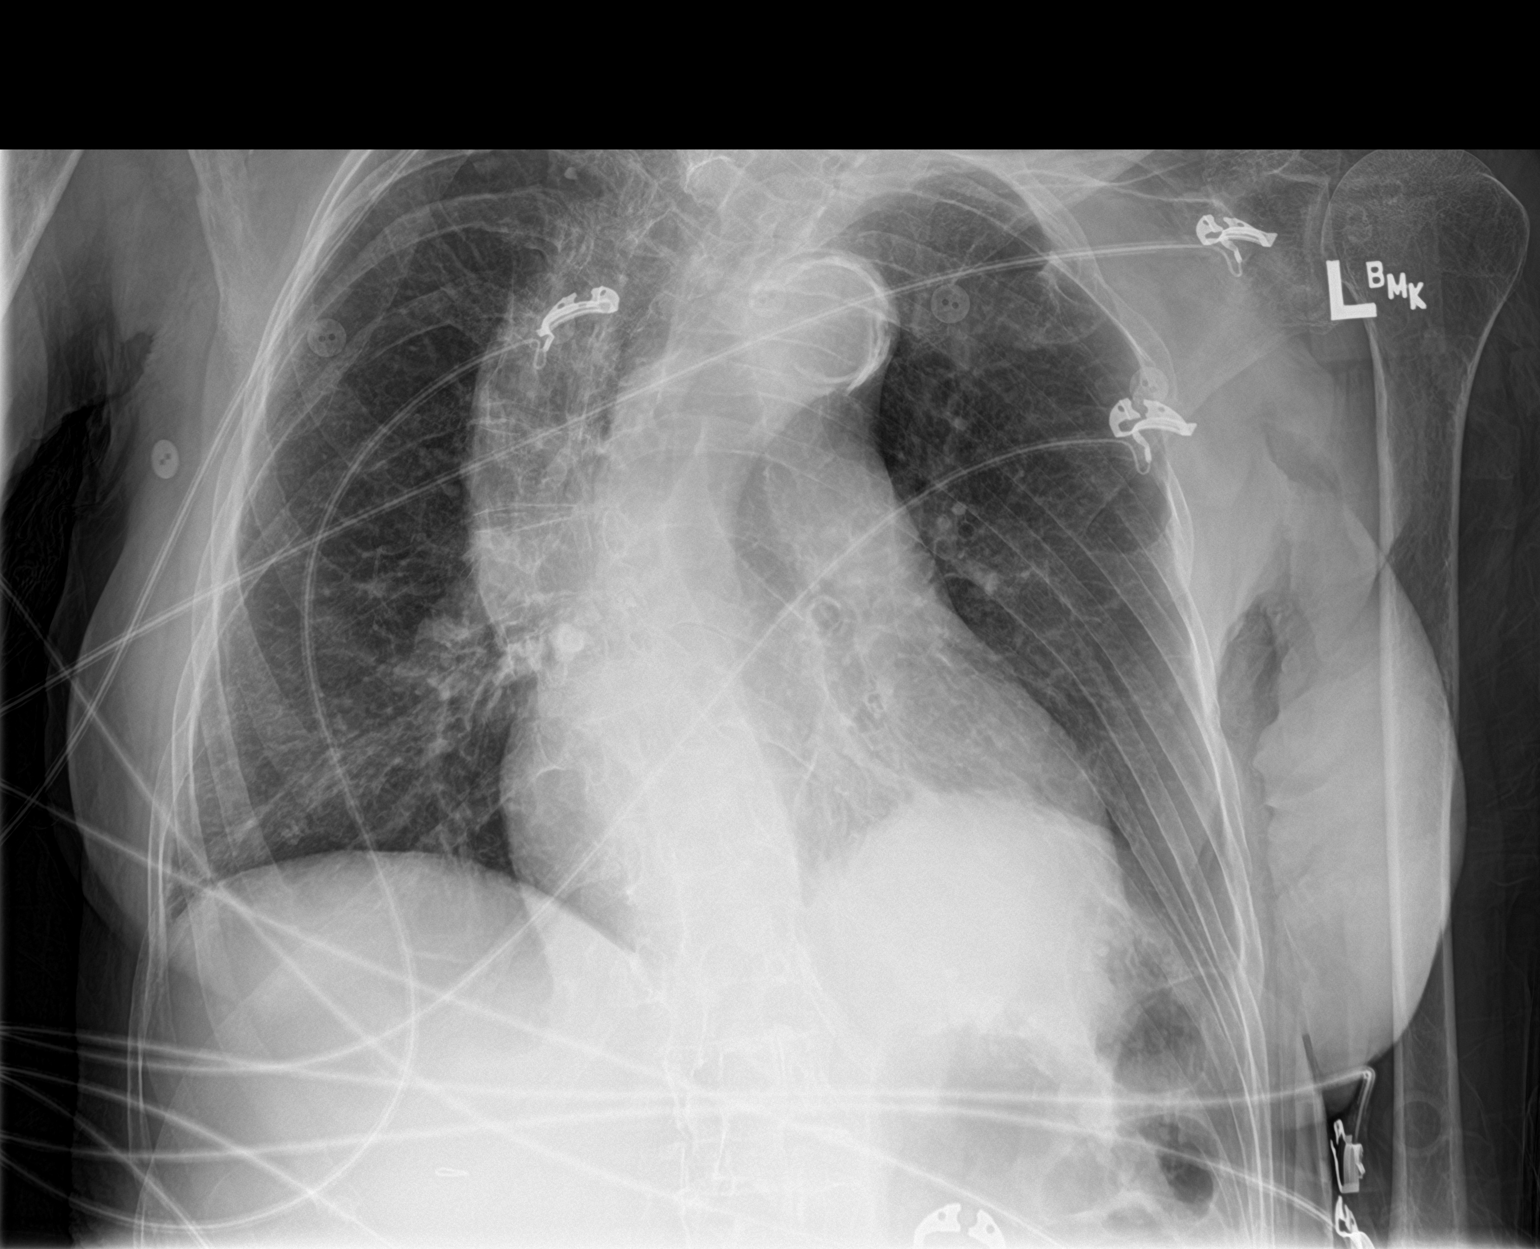

[1 of 1 positions shown; findings below may reference images not displayed]

FINDINGS: Minimal left lung base atelectatic changes. No focal consolidation,
pleural effusion, or pneumothorax. Mild cardiomegaly.
Atherosclerotic calcification of the aortic arch. The aorta is
tortuous. No acute osseous pathology. Osteopenia.
IMPRESSION: No active disease.

## 2021-03-31 IMAGING — CT CT HEAD W/O CM
2 series · 15 of 40 positions shown, 18 images · non-contrast
Comparison: None.

CLINICAL DATA: Head trauma, unwitnessed fall, small laceration
above RIGHT eye and RIGHT eye hematoma.

EXAM:
CT HEAD WITHOUT CONTRAST
TECHNIQUE: Contiguous axial images were obtained from the base of the skull
through the vertex without intravenous contrast.

[Series 3: head wo · axial · 0.46mm/px · z∈[+62,+192]mm · 12 of 32 slices shown, 15 images]
[im 3/32  brain]
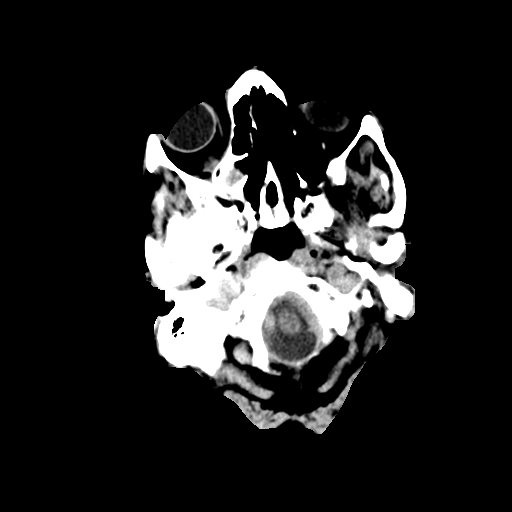
[im 3/32  bone]
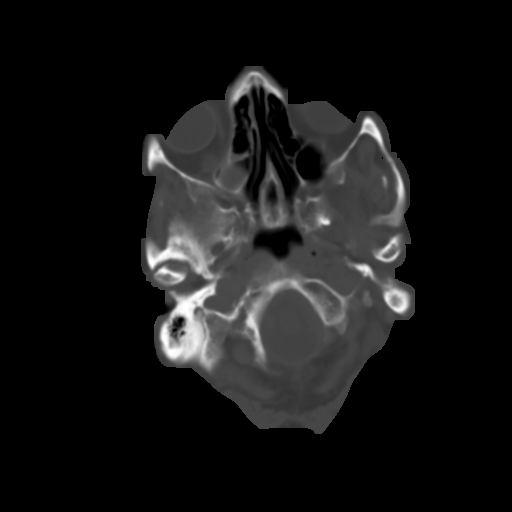
[im 5/32  brain]
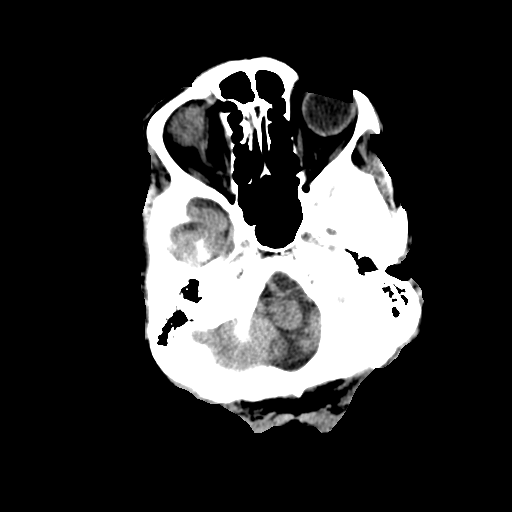
[im 7/32  brain]
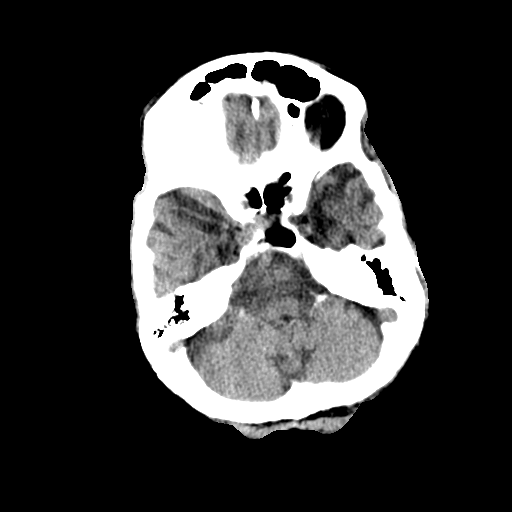
[im 10/32  brain]
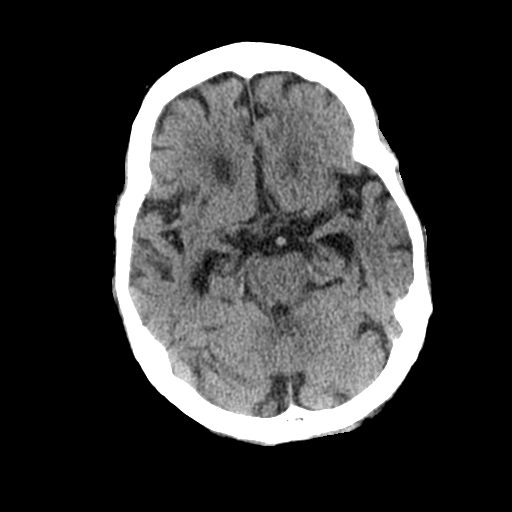
[im 12/32  brain]
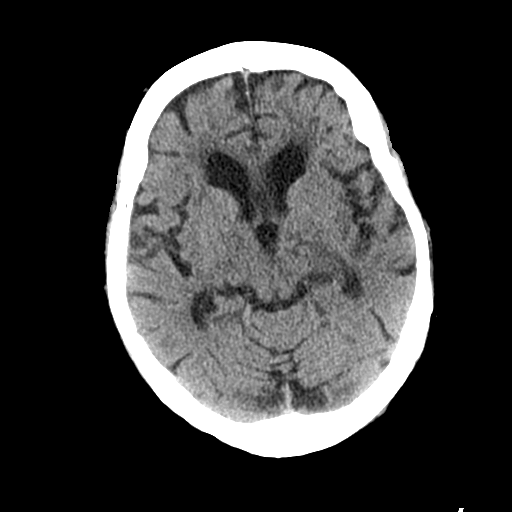
[im 12/32  bone]
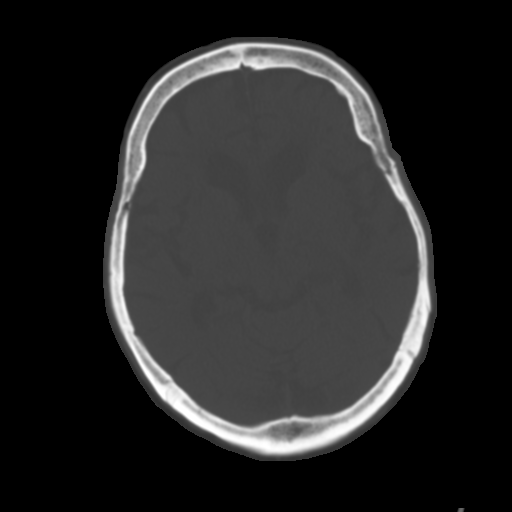
[im 14/32  brain]
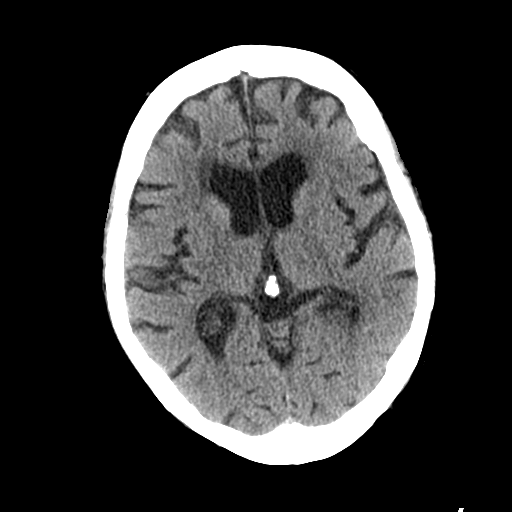
[im 18/32  brain]
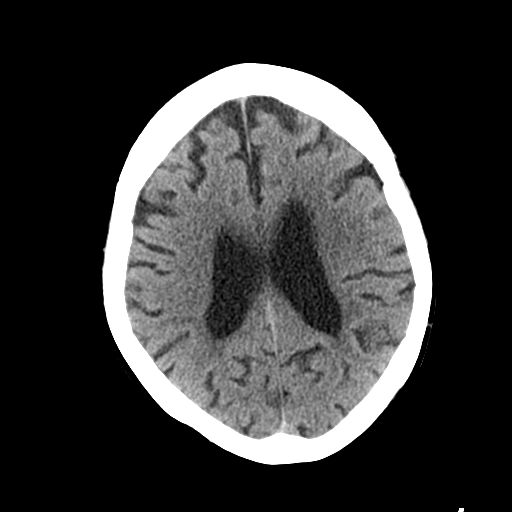
[im 20/32  brain]
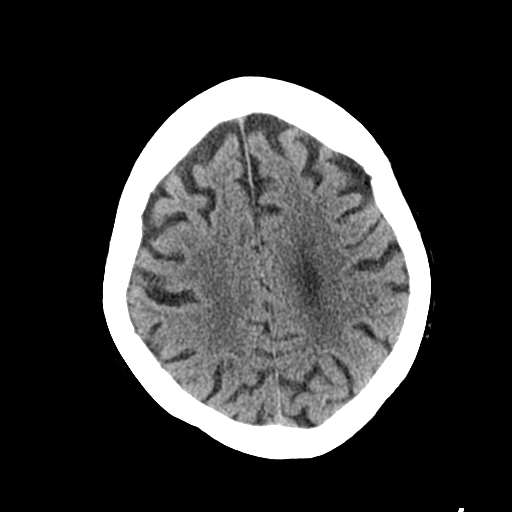
[im 22/32  brain]
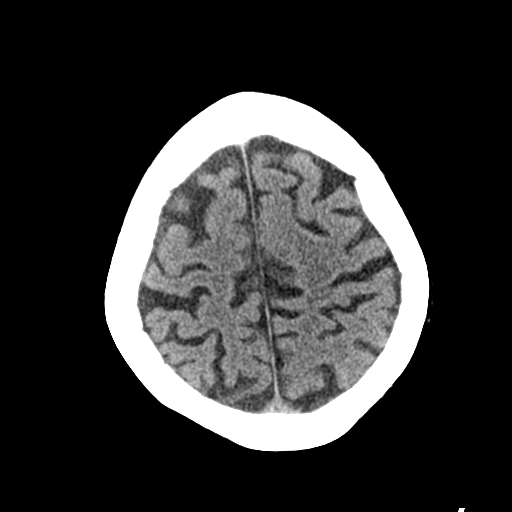
[im 22/32  bone]
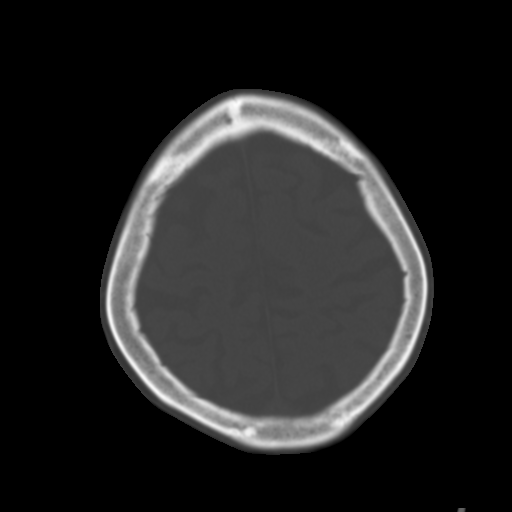
[im 25/32  brain]
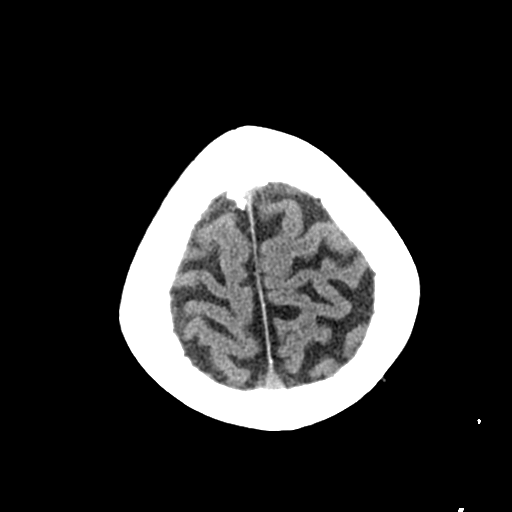
[im 27/32  brain]
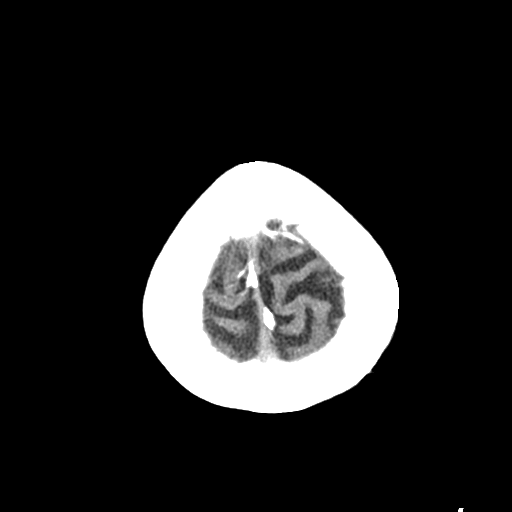
[im 29/32  brain]
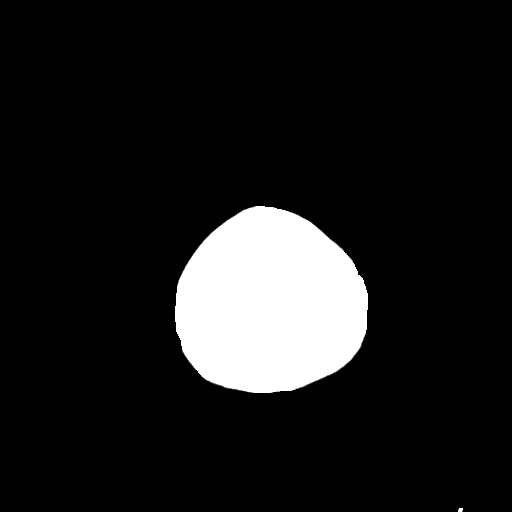

[Series 4: coronal soft tissue · coronal · 0.34mm/px · 3 of 66 slices shown]
[im 22/66  brain]
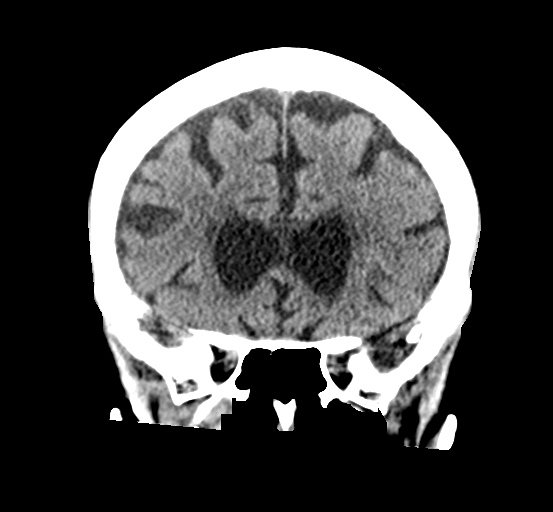
[im 29/66  brain]
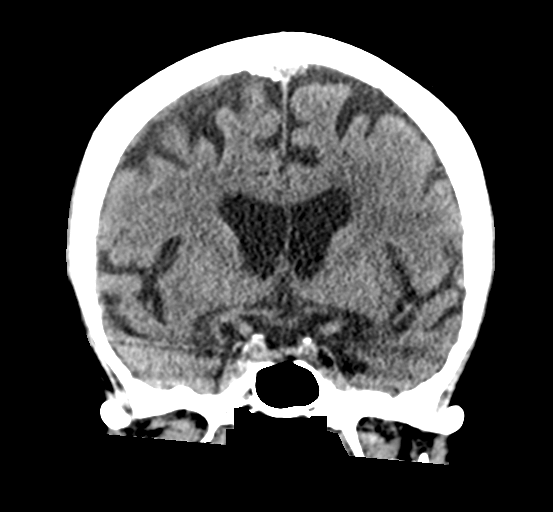
[im 37/66  brain]
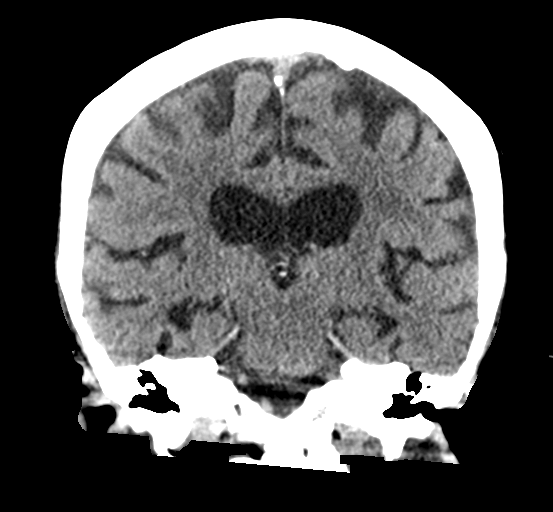

[15 of 40 positions shown; findings below may reference images not displayed]

FINDINGS: Brain: Generalized age related parenchymal volume loss with
commensurate dilatation of the ventricles and sulci. Mild chronic
small vessel ischemic changes within the bilateral periventricular
white matter regions.

No mass, hemorrhage, edema or other evidence of acute parenchymal
abnormality. No extra-axial hemorrhage.

Vascular: Chronic calcified atherosclerotic changes of the large
vessels at the skull base. No unexpected hyperdense vessel.

Skull: Normal. Negative for fracture or focal lesion.

Sinuses/Orbits: Suspected fracture of the RIGHT orbital floor,
incompletely imaged.

Other: None.
IMPRESSION: 1. Suspected fracture of the RIGHT orbital floor, incompletely
imaged. Recommend facial bone CT for further characterization.
2. No acute intracranial abnormality. No intracranial mass,
hemorrhage or edema. No skull fracture.

## 2021-03-31 IMAGING — CR DG FEMUR 2+V PORT*R*
1 series · 4 of 4 positions shown · non-contrast
Comparison: 05/22/2019

CLINICAL DATA: Right leg pain after fall

EXAM:
RIGHT FEMUR PORTABLE 2 VIEW

[Series 1: view not recorded · 0.14mm/px · 4 of 4 slices shown]
[im 1/4]
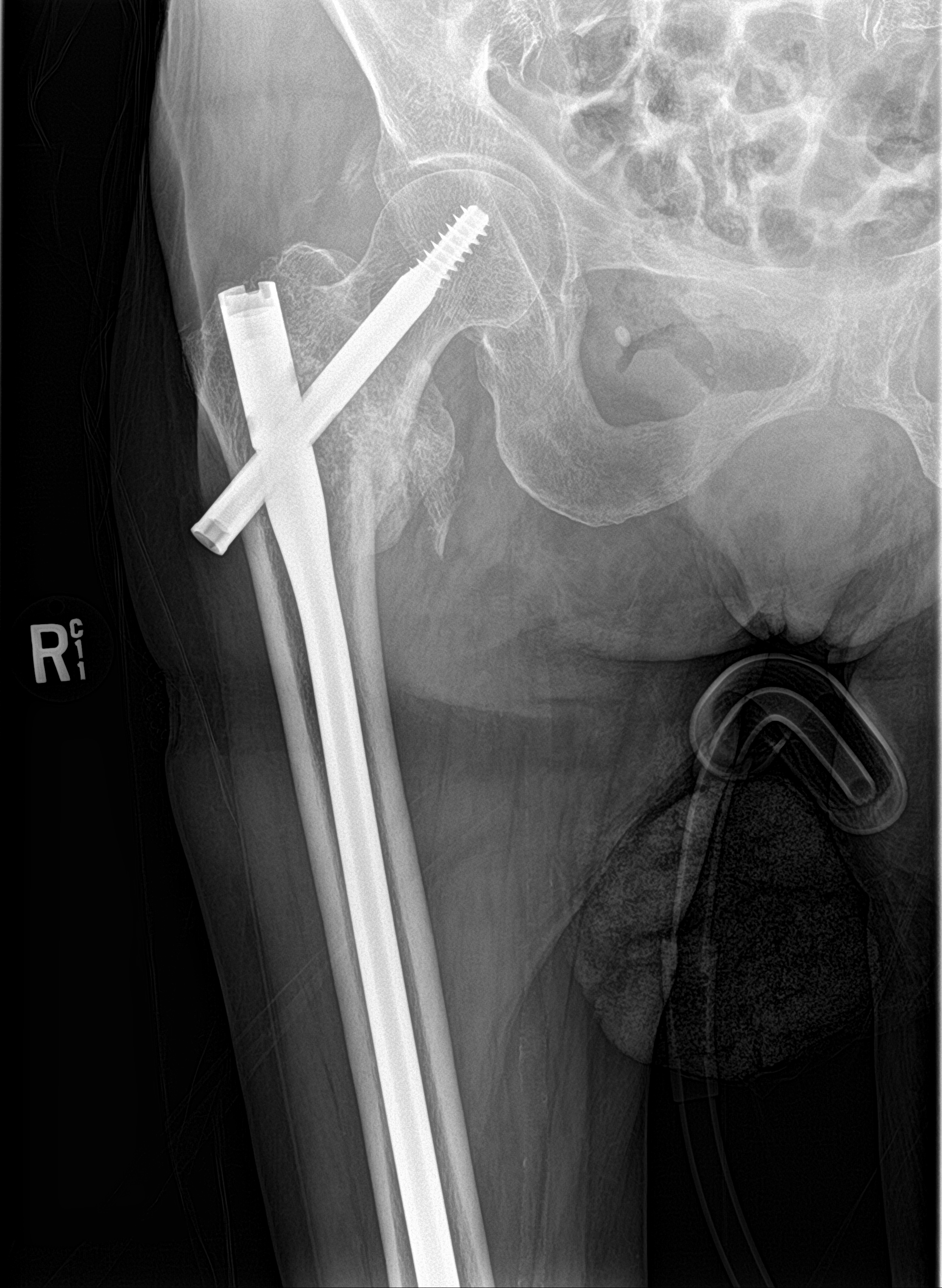
[im 2/4]
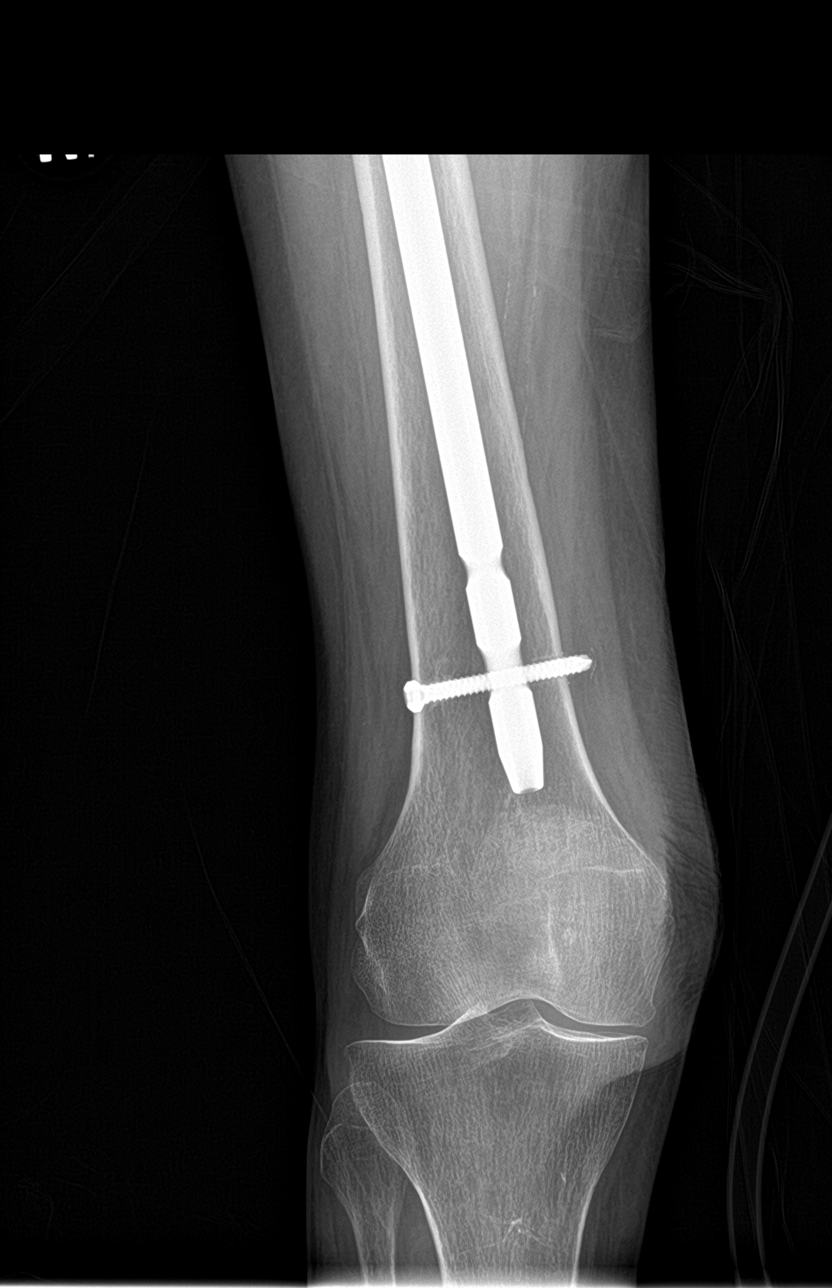
[im 3/4]
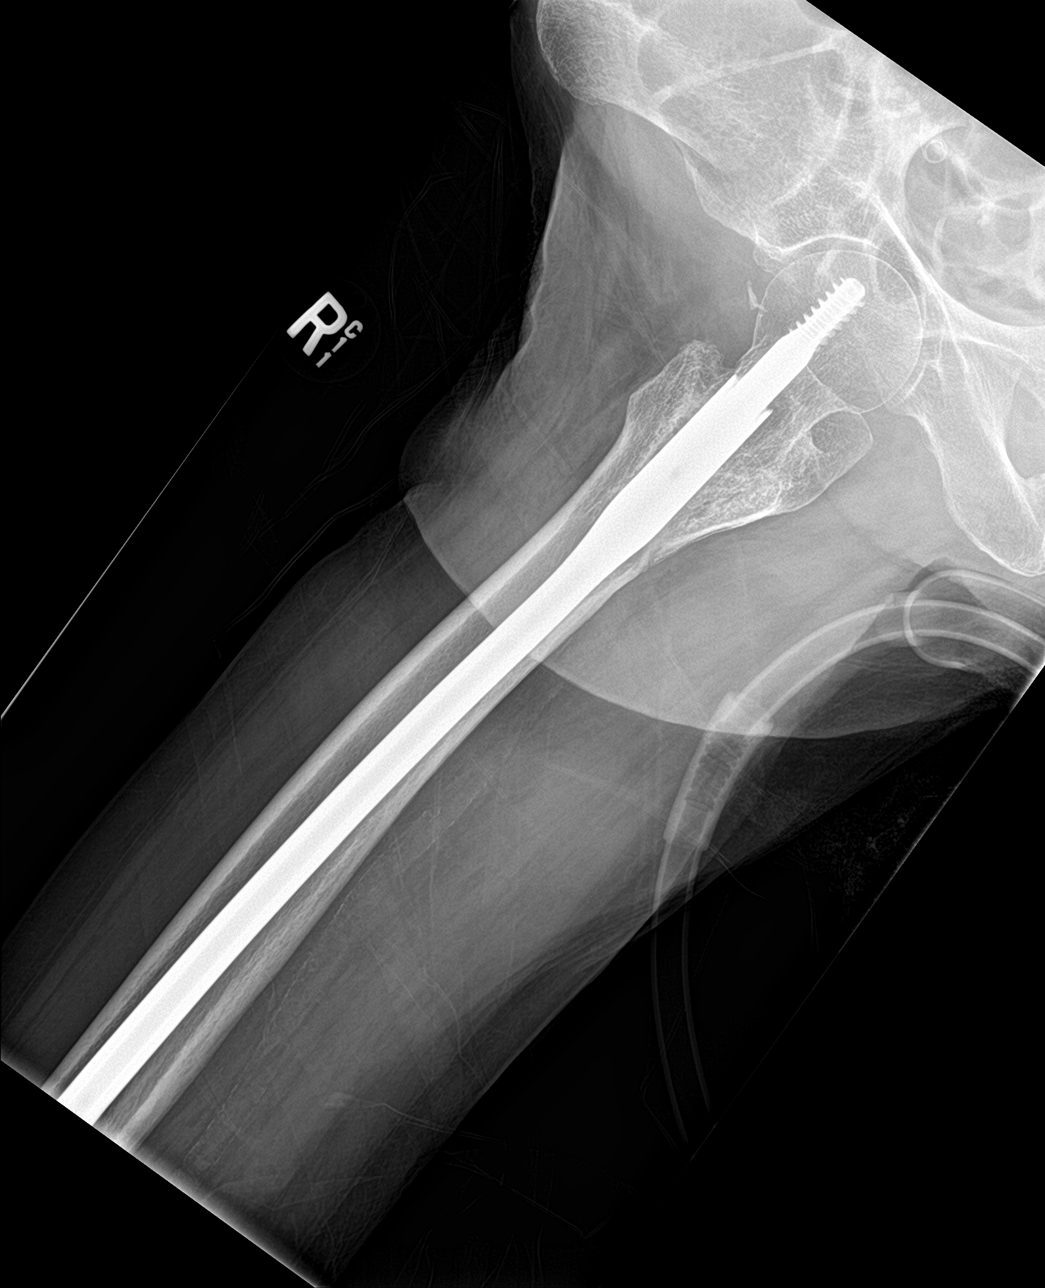
[im 4/4]
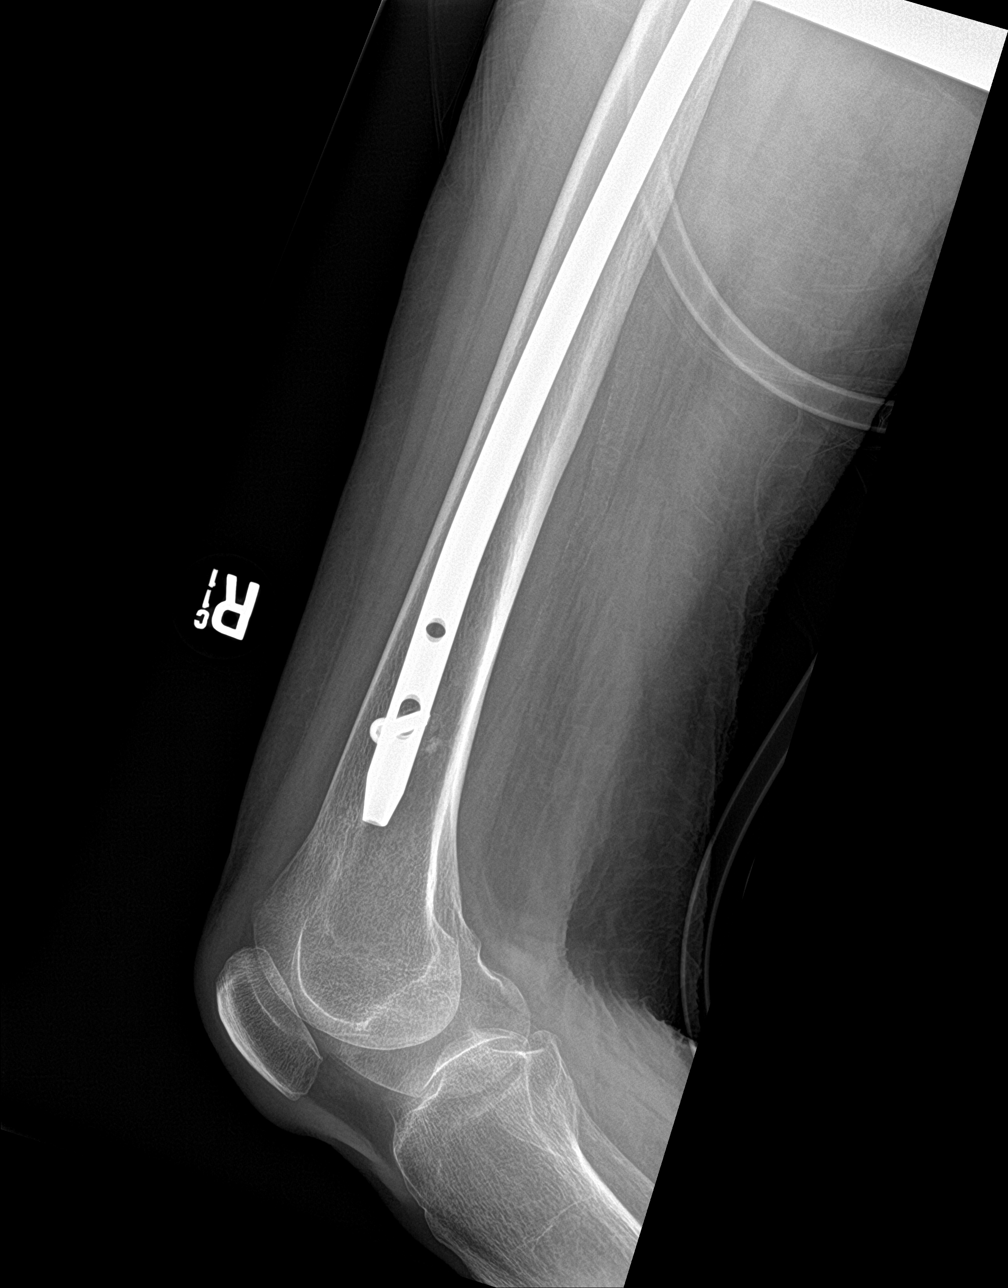

[4 of 4 positions shown; findings below may reference images not displayed]

FINDINGS: Prior ORIF of an intertrochanteric right hip fracture with long stem
intramedullary rod, single distal interlocking screw, and proximal
lag screw. Lesser trochanteric fragment remains mildly displaced.
Original fracture line appears well healed. No perihardware lucency
or fracture. Alignment at the hip and knee joints remain anatomic.
No knee joint effusion. No focal soft tissue abnormality. Vascular
calcifications.
IMPRESSION: 1. No acute fracture or dislocation.
2. Prior ORIF of an intertrochanteric right hip fracture without
hardware complication.

## 2021-04-15 ENCOUNTER — Emergency Department
Admission: EM | Admit: 2021-04-15 | Discharge: 2021-04-15 | Disposition: A | Discharge status: Home / Self Care | Payer: Medicare PPO | Source: Home / Self Care | Attending: Emergency Medicine | Admitting: Emergency Medicine

## 2021-04-15 ENCOUNTER — Emergency Department: Payer: Medicare PPO

## 2021-04-15 ENCOUNTER — Other Ambulatory Visit: Payer: Self-pay

## 2021-04-15 DIAGNOSIS — M25552 Pain in left hip: Secondary | ICD-10-CM | POA: Insufficient documentation

## 2021-04-15 DIAGNOSIS — F0390 Unspecified dementia without behavioral disturbance: Secondary | ICD-10-CM | POA: Insufficient documentation

## 2021-04-15 DIAGNOSIS — Y92002 Bathroom of unspecified non-institutional (private) residence single-family (private) house as the place of occurrence of the external cause: Secondary | ICD-10-CM | POA: Insufficient documentation

## 2021-04-15 DIAGNOSIS — W19XXXA Unspecified fall, initial encounter: Secondary | ICD-10-CM

## 2021-04-15 DIAGNOSIS — S72142A Displaced intertrochanteric fracture of left femur, initial encounter for closed fracture: Secondary | ICD-10-CM | POA: Diagnosis not present

## 2021-04-15 DIAGNOSIS — R609 Edema, unspecified: Secondary | ICD-10-CM | POA: Insufficient documentation

## 2021-04-15 DIAGNOSIS — W1839XA Other fall on same level, initial encounter: Secondary | ICD-10-CM | POA: Insufficient documentation

## 2021-04-15 DIAGNOSIS — E119 Type 2 diabetes mellitus without complications: Secondary | ICD-10-CM | POA: Insufficient documentation

## 2021-04-15 DIAGNOSIS — I10 Essential (primary) hypertension: Secondary | ICD-10-CM | POA: Insufficient documentation

## 2021-04-15 LAB — CBC WITH DIFFERENTIAL/PLATELET
Abs Immature Granulocytes: 0.02 10*3/uL (ref 0.00–0.07)
Basophils Absolute: 0 10*3/uL (ref 0.0–0.1)
Basophils Relative: 1 %
Eosinophils Absolute: 0 10*3/uL (ref 0.0–0.5)
Eosinophils Relative: 0 %
HCT: 36.5 % (ref 36.0–46.0)
Hemoglobin: 12.1 g/dL (ref 12.0–15.0)
Immature Granulocytes: 0 %
Lymphocytes Relative: 15 %
Lymphs Abs: 1 10*3/uL (ref 0.7–4.0)
MCH: 33.5 pg (ref 26.0–34.0)
MCHC: 33.2 g/dL (ref 30.0–36.0)
MCV: 101.1 fL — ABNORMAL HIGH (ref 80.0–100.0)
Monocytes Absolute: 0.9 10*3/uL (ref 0.1–1.0)
Monocytes Relative: 13 %
Neutro Abs: 4.6 10*3/uL (ref 1.7–7.7)
Neutrophils Relative %: 71 %
Platelets: 159 10*3/uL (ref 150–400)
RBC: 3.61 MIL/uL — ABNORMAL LOW (ref 3.87–5.11)
RDW: 13.2 % (ref 11.5–15.5)
WBC: 6.6 10*3/uL (ref 4.0–10.5)
nRBC: 0 % (ref 0.0–0.2)

## 2021-04-15 LAB — URINALYSIS, COMPLETE (UACMP) WITH MICROSCOPIC
Bacteria, UA: NONE SEEN
Bilirubin Urine: NEGATIVE
Glucose, UA: NEGATIVE mg/dL
Hgb urine dipstick: NEGATIVE
Ketones, ur: NEGATIVE mg/dL
Leukocytes,Ua: NEGATIVE
Nitrite: NEGATIVE
Protein, ur: NEGATIVE mg/dL
Specific Gravity, Urine: 1.013 (ref 1.005–1.030)
Squamous Epithelial / HPF: NONE SEEN (ref 0–5)
pH: 8 (ref 5.0–8.0)

## 2021-04-15 LAB — COMPREHENSIVE METABOLIC PANEL
ALT: 9 U/L (ref 0–44)
AST: 18 U/L (ref 15–41)
Albumin: 4 g/dL (ref 3.5–5.0)
Alkaline Phosphatase: 36 U/L — ABNORMAL LOW (ref 38–126)
Anion gap: 7 (ref 5–15)
BUN: 15 mg/dL (ref 8–23)
CO2: 30 mmol/L (ref 22–32)
Calcium: 8.4 mg/dL — ABNORMAL LOW (ref 8.9–10.3)
Chloride: 100 mmol/L (ref 98–111)
Creatinine, Ser: 0.52 mg/dL (ref 0.44–1.00)
GFR, Estimated: >60 mL/min (ref >60)
Glucose, Bld: 96 mg/dL (ref 70–99)
Potassium: 4.1 mmol/L (ref 3.5–5.1)
Sodium: 137 mmol/L (ref 135–145)
Total Bilirubin: 0.7 mg/dL (ref 0.3–1.2)
Total Protein: 7 g/dL (ref 6.5–8.1)

## 2021-04-15 NOTE — Discharge Instructions (Signed)
Use Tylenol for pain and fevers.  Up to 1000 mg per dose, up to 4 times per day.  Do not take more than 4000 mg of Tylenol/acetaminophen within 24 hours..  

## 2021-04-15 NOTE — ED Triage Notes (Signed)
come from home; caregiver found her in bathroom; denies LOC; has dementia; on 2L, vitals stable; c/o pain in left hip has previous break in left hip has steel rod, 148/65,HR 60 CBG 108

## 2021-04-15 NOTE — ED Provider Notes (Signed)
[facility] Emergency Department Provider Note ____________________________________________   Event Date/Time   First MD Initiated Contact with Patient 04/15/21 1005     (approximate)  I have reviewed the triage vital signs and the nursing notes.  HISTORY  Chief Complaint Fall   HPI Cynthia Hart is a 87 y.o. femalewho presents to the ED for evaluation of unwitnessed fall.   Chart review indicates history of HTN, osteoarthritis and right-sided intertrochanteric IM nailing 2 years ago.  Dementia.  Patient presents to the ED, accompanied by her daughter, for evaluation of an unwitnessed fall.  Patient lives with caregiver who stays throughout the day, but often is alone at night while sleeping.  Ambulatory with a walker and assistance.   Daughter reports concern for an unwitnessed fall that occurred overnight last night.  Daughter reports that patient's caregiver found her in the bathroom floor this morning when caregiver arrived.  Patient is reporting left hip pain and otherwise is unable to provide any relevant history due to her dementia.  Daughter reports that she is at her behavioral baseline and seems okay.   Past Medical History:  Diagnosis Date  . Diabetes ([place])   . Hypertension   . Osteoarthritis   . Osteoporosis   . Postmenopausal   . Tumor   . Vitamin D deficiency     Patient Active Problem List   Diagnosis Date Noted  . Ileus ([place]) 05/31/2019  . Urinary retention 05/30/2019  . Hip fracture ([place]) 05/22/2019  . Pre-operative cardiovascular examination 09/05/2018  . 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 Moderate to severe mitral regurgitation 09/05/2018  . Pulmonary hypertension, unspecified ([place]) 09/05/2018  . Postoperative examination 08/14/2018  . 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 Heart murmur 08/11/2018  . Dysphagia 08/11/2018    Past Surgical History:  Procedure Laterality Date  . CATARACT EXTRACTION     . CHOLECYSTECTOMY    . INTRAMEDULLARY (IM) NAIL INTERTROCHANTERIC Right 05/22/2019   Procedure: INTRAMEDULLARY (IM) NAIL INTERTROCHANTRIC;  Surgeon: Hessie Knows, MD;  Location: [facility] ORS;  Service: Orthopedics;  Laterality: Right;    Prior to Admission medications   Medication Sig Start Date End Date Taking? Authorizing Provider  acetaminophen (TYLENOL) 500 MG tablet Take 1,000 mg by mouth 2 (two) times daily.   Yes [provider]  Cholecalciferol (VITAMIN D3) 50000 units CAPS Take 1 capsule by mouth every 14 (fourteen) days.   Yes [provider]  donepezil (ARICEPT) 10 MG tablet Take 10 mg by mouth at bedtime.   Yes [provider]  gabapentin (NEURONTIN) 100 MG capsule Take 100 mg by mouth 3 (three) times daily.   Yes [provider]  azithromycin (ZITHROMAX Z-PAK) 250 MG tablet Take 2 tablets (500 mg) on  Day 1,  followed by 1 tablet (250 mg) once daily on Days 2 through 5. Patient not taking: No sig reported 03/25/20   Earleen Newport, MD    Allergies Alendronate, Codeine, Ibandronic acid, and Penicillins  Family History  Problem Relation Age of Onset  . Breast cancer Mother   . Dementia Brother   . Heart disease Maternal Grandmother   . Hypertension Paternal Grandfather     Social History Social History   Tobacco Use  . Smoking status: Never Smoker  . Smokeless tobacco: Never Used  Substance Use Topics  . Alcohol use: Not Currently  . Drug use: Never    Review of Systems  Unable to be accurately assessed due to patient's altered mentation and dementia,  at baseline ____________________________________________   PHYSICAL EXAM:  VITAL SIGNS: Vitals:   04/15/21 1200 04/15/21 1230  BP:  116/78  Pulse: (!) 118 63  Resp: (!) 22 19  Temp:    SpO2: (!) 89% 92%    Constitutional: Alert and pleasantly disoriented to time, situation and location. Well appearing and in no acute distress. Eyes: Conjunctivae are normal. PERRL.  EOMI. Head: Atraumatic. Nose: No congestion/rhinnorhea. Mouth/Throat: Mucous membranes are moist.  Oropharynx non-erythematous. Neck: No stridor. No cervical spine tenderness to palpation. Cardiovascular: Normal rate, regular rhythm. Grossly normal heart sounds.  Good peripheral circulation. Respiratory: Normal respiratory effort.  No retractions. Lungs CTAB. Gastrointestinal: Soft , nondistended, nontender to palpation. No CVA tenderness. Musculoskeletal:  No joint effusions. No signs of acute trauma. Trace pitting edema to bilateral lower extremities symmetrically with some signs of chronic venous stasis dermatitis.  No hip pain bilaterally to logrolling legs Neurologic:  Normal speech and language. No gross focal neurologic deficits are appreciated.  Skin:  Skin is warm, dry and intact. No rash noted. Psychiatric: Mood and affect are normal. Speech and behavior are normal. ____________________________________________   LABS (all labs ordered are listed, but only abnormal results are displayed)  Labs Reviewed  COMPREHENSIVE METABOLIC PANEL - Abnormal; Notable for the following components:      Result Value   Calcium 8.4 (*)    Alkaline Phosphatase 36 (*)    All other components within normal limits  CBC WITH DIFFERENTIAL/PLATELET - Abnormal; Notable for the following components:   RBC 3.61 (*)    MCV 101.1 (*)    All other components within normal limits  URINALYSIS, COMPLETE (UACMP) WITH MICROSCOPIC - Abnormal; Notable for the following components:   Color, Urine YELLOW (*)    APPearance HAZY (*)    All other components within normal limits   ____________________________________________  12 Lead EKG  Sinus rhythm, rate of 58 bpm.  Normal axis and intervals.  Stigmata of LVH.  No STEMI.  Similar to EKG from 02/2020 ____________________________________________  RADIOLOGY  ED MD interpretation: CT head reviewed by me without evidence of acute intracranial pathology.  Plain  film of the left hip reviewed by me without evidence of acute fracture  Official radiology report(s): CT Head Wo Contrast  Result Date: 04/15/2021 CLINICAL DATA:  Pain after fall. EXAM: CT HEAD WITHOUT CONTRAST CT CERVICAL SPINE WITHOUT CONTRAST TECHNIQUE: Multidetector CT imaging of the head and cervical spine was performed following the standard protocol without intravenous contrast. Multiplanar CT image reconstructions of the cervical spine were also generated. COMPARISON:  CT of the brain March 25, 2020 FINDINGS: CT HEAD FINDINGS Brain: Ventricles and sulci are mildly prominent. No subdural, epidural, or subarachnoid hemorrhage. Cerebellum, brainstem, and basal cisterns are normal. No acute cortical ischemia or infarct. No mass effect or midline shift. Vascular: No hyperdense vessel or unexpected calcification. Skull: Normal. Negative for fracture or focal lesion. Sinuses/Orbits: No acute finding. Other: None. CT CERVICAL SPINE FINDINGS Alignment: There is scoliotic curvature of the cervical and upper thoracic spine. There is 3.5 mm anterolisthesis of C7 versus T1. No other malalignment. Skull base and vertebrae: No acute fracture. No primary bone lesion or focal pathologic process. Soft tissues and spinal canal: No prevertebral fluid or swelling. No visible canal hematoma. Disc levels: Facet degenerative changes. Degenerative disc disease most prominent at C5-6 with small osteophytes. Upper chest: Negative. Other: No other abnormalities. IMPRESSION: 1. No acute intracranial abnormalities. 2. 3.5 mm anterolisthesis of C7 versus T1 without associated soft tissue  swelling/thickening or fracture. The finding is likely due to facet degenerative changes at this level. No acute fracture or other significant malalignment identified. Electronically Signed   By: Dorise Bullion III M.D   On: 04/15/2021 11:23   CT Cervical Spine Wo Contrast  Result Date: 04/15/2021 CLINICAL DATA:  Pain after fall. EXAM: CT HEAD  WITHOUT CONTRAST CT CERVICAL SPINE WITHOUT CONTRAST TECHNIQUE: Multidetector CT imaging of the head and cervical spine was performed following the standard protocol without intravenous contrast. Multiplanar CT image reconstructions of the cervical spine were also generated. COMPARISON:  CT of the brain March 25, 2020 FINDINGS: CT HEAD FINDINGS Brain: Ventricles and sulci are mildly prominent. No subdural, epidural, or subarachnoid hemorrhage. Cerebellum, brainstem, and basal cisterns are normal. No acute cortical ischemia or infarct. No mass effect or midline shift. Vascular: No hyperdense vessel or unexpected calcification. Skull: Normal. Negative for fracture or focal lesion. Sinuses/Orbits: No acute finding. Other: None. CT CERVICAL SPINE FINDINGS Alignment: There is scoliotic curvature of the cervical and upper thoracic spine. There is 3.5 mm anterolisthesis of C7 versus T1. No other malalignment. Skull base and vertebrae: No acute fracture. No primary bone lesion or focal pathologic process. Soft tissues and spinal canal: No prevertebral fluid or swelling. No visible canal hematoma. Disc levels: Facet degenerative changes. Degenerative disc disease most prominent at C5-6 with small osteophytes. Upper chest: Negative. Other: No other abnormalities. IMPRESSION: 1. No acute intracranial abnormalities. 2. 3.5 mm anterolisthesis of C7 versus T1 without associated soft tissue swelling/thickening or fracture. The finding is likely due to facet degenerative changes at this level. No acute fracture or other significant malalignment identified. Electronically Signed   By: Dorise Bullion III M.D   On: 04/15/2021 11:23   DG Hip Unilat W or Wo Pelvis 2-3 Views Left  Result Date: 04/15/2021 CLINICAL DATA:  87 year old female status post fall.  Found down. EXAM: DG HIP (WITH OR WITHOUT PELVIS) 2-3V LEFT COMPARISON:  Left femur series 03/25/2020. FINDINGS: Previous proximal right femur ORIF. Femoral heads normally  located. No pelvis fracture identified. Proximal left femur appears stable and intact. Similar generalized bowel gas in the lower abdomen and pelvis. IMPRESSION: No acute fracture or dislocation identified about the left hip or pelvis. Electronically Signed   By: Genevie Ann M.D.   On: 04/15/2021 11:02    ____________________________________________   PROCEDURES and INTERVENTIONS  Procedure(s) performed (including Critical Care):  .1-3 Lead EKG Interpretation Performed by: Vladimir Crofts, MD Authorized by: Vladimir Crofts, MD     Interpretation: normal     ECG rate:  66   ECG rate assessment: normal     Rhythm: sinus rhythm     Ectopy: none     Conduction: normal      Medications - No data to display  ____________________________________________   MDM / ED COURSE   87 year old woman with dementia presents to the ED after an unwitnessed fall, without evidence of traumatic pathology, and amenable to outpatient management.  Normal vitals on room air.  Exam is reassuring without evidence of traumatic pathology, neurologic or vascular deficits.  She is at her behavioral baseline with chronic disorientation and dementia, per daughter at the bedside.  Work-up is benign without evidence of ICH, cranial fracture, cervical fracture, acute cystitis.  No evidence of hip fracture or periprosthetic dislodgment.  I see no barriers to outpatient management and she is stable for discharge  Clinical Course as of 04/15/21 1405  Sat Apr 15, 2021  1237 Reassessed and discussed  work-up with daughter at the bedside.  Patient continues to feel well and has normal vital signs. [DS]    Clinical Course User Index [DS] Vladimir Crofts, MD    ____________________________________________   FINAL CLINICAL IMPRESSION(S) / ED DIAGNOSES  Final diagnoses:  Fall, initial encounter     ED Discharge Orders    None       Aireal Slater Tamala Julian   Note:  This document was prepared using Dragon voice recognition software  and may include unintentional dictation errors.   Vladimir Crofts, MD 04/15/21 [phone]

## 2021-04-15 NOTE — ED Notes (Signed)
Pt unable to sign dispo signature pad  

## 2021-04-17 ENCOUNTER — Inpatient Hospital Stay
Admission: EM | Admit: 2021-04-17 | Discharge: 2021-04-26 | DRG: 480 | Disposition: E | Payer: Medicare PPO | Attending: Internal Medicine | Admitting: Internal Medicine

## 2021-04-17 ENCOUNTER — Encounter: Admission: EM | Disposition: E | Payer: Self-pay | Source: Home / Self Care | Attending: Internal Medicine

## 2021-04-17 ENCOUNTER — Inpatient Hospital Stay: Payer: Medicare PPO | Admitting: Anesthesiology

## 2021-04-17 ENCOUNTER — Inpatient Hospital Stay: Payer: Medicare PPO

## 2021-04-17 ENCOUNTER — Emergency Department: Payer: Medicare PPO

## 2021-04-17 ENCOUNTER — Encounter: Payer: Self-pay | Admitting: Emergency Medicine

## 2021-04-17 ENCOUNTER — Other Ambulatory Visit: Payer: Self-pay

## 2021-04-17 DIAGNOSIS — I9581 Postprocedural hypotension: Secondary | ICD-10-CM | POA: Diagnosis not present

## 2021-04-17 DIAGNOSIS — I272 Pulmonary hypertension, unspecified: Secondary | ICD-10-CM | POA: Diagnosis present

## 2021-04-17 DIAGNOSIS — G309 Alzheimer's disease, unspecified: Secondary | ICD-10-CM | POA: Diagnosis not present

## 2021-04-17 DIAGNOSIS — K567 Ileus, unspecified: Secondary | ICD-10-CM | POA: Diagnosis present

## 2021-04-17 DIAGNOSIS — Z515 Encounter for palliative care: Secondary | ICD-10-CM

## 2021-04-17 DIAGNOSIS — S7292XA Unspecified fracture of left femur, initial encounter for closed fracture: Secondary | ICD-10-CM | POA: Diagnosis present

## 2021-04-17 DIAGNOSIS — J9621 Acute and chronic respiratory failure with hypoxia: Secondary | ICD-10-CM | POA: Diagnosis present

## 2021-04-17 DIAGNOSIS — Z9049 Acquired absence of other specified parts of digestive tract: Secondary | ICD-10-CM

## 2021-04-17 DIAGNOSIS — J9610 Chronic respiratory failure, unspecified whether with hypoxia or hypercapnia: Secondary | ICD-10-CM | POA: Diagnosis not present

## 2021-04-17 DIAGNOSIS — E872 Acidosis: Secondary | ICD-10-CM | POA: Diagnosis not present

## 2021-04-17 DIAGNOSIS — M810 Age-related osteoporosis without current pathological fracture: Secondary | ICD-10-CM | POA: Diagnosis present

## 2021-04-17 DIAGNOSIS — J690 Pneumonitis due to inhalation of food and vomit: Secondary | ICD-10-CM | POA: Diagnosis present

## 2021-04-17 DIAGNOSIS — Z681 Body mass index (BMI) 19 or less, adult: Secondary | ICD-10-CM

## 2021-04-17 DIAGNOSIS — Z79899 Other long term (current) drug therapy: Secondary | ICD-10-CM | POA: Diagnosis not present

## 2021-04-17 DIAGNOSIS — W1830XA Fall on same level, unspecified, initial encounter: Secondary | ICD-10-CM | POA: Diagnosis present

## 2021-04-17 DIAGNOSIS — D62 Acute posthemorrhagic anemia: Secondary | ICD-10-CM | POA: Diagnosis not present

## 2021-04-17 DIAGNOSIS — S72142A Displaced intertrochanteric fracture of left femur, initial encounter for closed fracture: Secondary | ICD-10-CM | POA: Diagnosis present

## 2021-04-17 DIAGNOSIS — E43 Unspecified severe protein-calorie malnutrition: Secondary | ICD-10-CM | POA: Diagnosis present

## 2021-04-17 DIAGNOSIS — F0390 Unspecified dementia without behavioral disturbance: Secondary | ICD-10-CM | POA: Diagnosis present

## 2021-04-17 DIAGNOSIS — Z20822 Contact with and (suspected) exposure to covid-19: Secondary | ICD-10-CM | POA: Diagnosis present

## 2021-04-17 DIAGNOSIS — I10 Essential (primary) hypertension: Secondary | ICD-10-CM | POA: Diagnosis present

## 2021-04-17 DIAGNOSIS — S72102A Unspecified trochanteric fracture of left femur, initial encounter for closed fracture: Secondary | ICD-10-CM | POA: Diagnosis not present

## 2021-04-17 DIAGNOSIS — E559 Vitamin D deficiency, unspecified: Secondary | ICD-10-CM | POA: Diagnosis present

## 2021-04-17 DIAGNOSIS — D696 Thrombocytopenia, unspecified: Secondary | ICD-10-CM | POA: Diagnosis not present

## 2021-04-17 DIAGNOSIS — M419 Scoliosis, unspecified: Secondary | ICD-10-CM | POA: Diagnosis present

## 2021-04-17 DIAGNOSIS — J9622 Acute and chronic respiratory failure with hypercapnia: Secondary | ICD-10-CM | POA: Diagnosis not present

## 2021-04-17 DIAGNOSIS — G9341 Metabolic encephalopathy: Secondary | ICD-10-CM | POA: Diagnosis not present

## 2021-04-17 DIAGNOSIS — Y92009 Unspecified place in unspecified non-institutional (private) residence as the place of occurrence of the external cause: Secondary | ICD-10-CM

## 2021-04-17 DIAGNOSIS — E119 Type 2 diabetes mellitus without complications: Secondary | ICD-10-CM | POA: Diagnosis present

## 2021-04-17 DIAGNOSIS — R0902 Hypoxemia: Secondary | ICD-10-CM

## 2021-04-17 DIAGNOSIS — G8929 Other chronic pain: Secondary | ICD-10-CM | POA: Diagnosis present

## 2021-04-17 DIAGNOSIS — Z66 Do not resuscitate: Secondary | ICD-10-CM | POA: Diagnosis present

## 2021-04-17 DIAGNOSIS — R109 Unspecified abdominal pain: Secondary | ICD-10-CM

## 2021-04-17 DIAGNOSIS — D6959 Other secondary thrombocytopenia: Secondary | ICD-10-CM | POA: Diagnosis not present

## 2021-04-17 DIAGNOSIS — I447 Left bundle-branch block, unspecified: Secondary | ICD-10-CM | POA: Diagnosis present

## 2021-04-17 DIAGNOSIS — Z419 Encounter for procedure for purposes other than remedying health state, unspecified: Secondary | ICD-10-CM

## 2021-04-17 DIAGNOSIS — F0280 Dementia in other diseases classified elsewhere without behavioral disturbance: Secondary | ICD-10-CM | POA: Diagnosis not present

## 2021-04-17 HISTORY — PX: INTRAMEDULLARY (IM) NAIL INTERTROCHANTERIC: SHX5875

## 2021-04-17 LAB — RESP PANEL BY RT-PCR (FLU A&B, COVID) ARPGX2
Influenza A by PCR: NEGATIVE
Influenza B by PCR: NEGATIVE
SARS Coronavirus 2 by RT PCR: NEGATIVE

## 2021-04-17 LAB — BASIC METABOLIC PANEL
Anion gap: 7 (ref 5–15)
BUN: 15 mg/dL (ref 8–23)
CO2: 29 mmol/L (ref 22–32)
Calcium: 8.3 mg/dL — ABNORMAL LOW (ref 8.9–10.3)
Chloride: 100 mmol/L (ref 98–111)
Creatinine, Ser: 0.5 mg/dL (ref 0.44–1.00)
GFR, Estimated: >60 mL/min (ref >60)
Glucose, Bld: 139 mg/dL — ABNORMAL HIGH (ref 70–99)
Potassium: 3.8 mmol/L (ref 3.5–5.1)
Sodium: 136 mmol/L (ref 135–145)

## 2021-04-17 LAB — GLUCOSE, CAPILLARY
Glucose-Capillary: 130 mg/dL — ABNORMAL HIGH (ref 70–99)
Glucose-Capillary: 121 mg/dL — ABNORMAL HIGH (ref 70–99)

## 2021-04-17 LAB — CBC WITH DIFFERENTIAL/PLATELET
Abs Immature Granulocytes: 0.06 10*3/uL (ref 0.00–0.07)
Basophils Absolute: 0 10*3/uL (ref 0.0–0.1)
Basophils Relative: 0 %
Eosinophils Absolute: 0 10*3/uL (ref 0.0–0.5)
Eosinophils Relative: 0 %
HCT: 34.4 % — ABNORMAL LOW (ref 36.0–46.0)
Hemoglobin: 11.5 g/dL — ABNORMAL LOW (ref 12.0–15.0)
Immature Granulocytes: 1 %
Lymphocytes Relative: 4 %
Lymphs Abs: 0.5 10*3/uL — ABNORMAL LOW (ref 0.7–4.0)
MCH: 34 pg (ref 26.0–34.0)
MCHC: 33.4 g/dL (ref 30.0–36.0)
MCV: 101.8 fL — ABNORMAL HIGH (ref 80.0–100.0)
Monocytes Absolute: 1.2 10*3/uL — ABNORMAL HIGH (ref 0.1–1.0)
Monocytes Relative: 9 %
Neutro Abs: 11.3 10*3/uL — ABNORMAL HIGH (ref 1.7–7.7)
Neutrophils Relative %: 86 %
Platelets: 169 10*3/uL (ref 150–400)
RBC: 3.38 MIL/uL — ABNORMAL LOW (ref 3.87–5.11)
RDW: 13.2 % (ref 11.5–15.5)
WBC: 13.1 10*3/uL — ABNORMAL HIGH (ref 4.0–10.5)
nRBC: 0 % (ref 0.0–0.2)

## 2021-04-17 LAB — PROTIME-INR
INR: 1 (ref 0.8–1.2)
Prothrombin Time: 13.3 seconds (ref 11.4–15.2)

## 2021-04-17 SURGERY — FIXATION, FRACTURE, INTERTROCHANTERIC, WITH INTRAMEDULLARY ROD
Anesthesia: Spinal | Laterality: Left

## 2021-04-17 MED ORDER — SODIUM CHLORIDE 0.9 % IV SOLN: 250.0000 mL | INTRAVENOUS | Status: DC

## 2021-04-17 MED ORDER — ACETAMINOPHEN 500 MG PO TABS: 1000.0000 mg | ORAL_TABLET | Freq: Two times a day (BID) | ORAL | Status: DC

## 2021-04-17 MED ORDER — MORPHINE SULFATE (PF) 2 MG/ML IV SOLN
0.5000 mg | INTRAVENOUS | Status: DC | PRN
  Administered 2021-04-17 – 2021-04-21 (×8): 0.5 mg via INTRAVENOUS
  Filled 2021-04-17 (×8): qty 1

## 2021-04-17 MED ORDER — ONDANSETRON HCL 4 MG/2ML IJ SOLN
4.0000 mg | Freq: Four times a day (QID) | INTRAMUSCULAR | Status: DC | PRN
  Administered 2021-04-18: 4 mg via INTRAVENOUS
  Filled 2021-04-17: qty 2

## 2021-04-17 MED ORDER — HYDROCODONE-ACETAMINOPHEN 5-325 MG PO TABS
1.0000 | ORAL_TABLET | Freq: Four times a day (QID) | ORAL | Status: DC | PRN
  Administered 2021-04-18 – 2021-04-20 (×2): 1 via ORAL
  Filled 2021-04-17 (×3): qty 1

## 2021-04-17 MED ORDER — EPHEDRINE SULFATE 50 MG/ML IJ SOLN
INTRAMUSCULAR | Status: DC | PRN
  Administered 2021-04-17 (×4): 5 mg via INTRAVENOUS
  Administered 2021-04-17: 10 mg via INTRAVENOUS
  Administered 2021-04-17 (×2): 5 mg via INTRAVENOUS

## 2021-04-17 MED ORDER — GABAPENTIN 100 MG PO CAPS
100.0000 mg | ORAL_CAPSULE | Freq: Three times a day (TID) | ORAL | Status: DC
  Administered 2021-04-17 – 2021-04-20 (×9): 100 mg via ORAL
  Filled 2021-04-17 (×8): qty 1

## 2021-04-17 MED ORDER — ACETAMINOPHEN 325 MG PO TABS: 325.0000 mg | ORAL_TABLET | Freq: Four times a day (QID) | ORAL | Status: DC | PRN

## 2021-04-17 MED ORDER — ACETAMINOPHEN 500 MG PO TABS
500.0000 mg | ORAL_TABLET | Freq: Four times a day (QID) | ORAL | Status: AC
  Administered 2021-04-18 (×3): 500 mg via ORAL
  Filled 2021-04-17 (×2): qty 1

## 2021-04-17 MED ORDER — PHENYLEPHRINE HCL-NACL 10-0.9 MG/250ML-% IV SOLN
25.0000 ug/min | INTRAVENOUS | Status: DC
  Administered 2021-04-17: 25 ug/min via INTRAVENOUS
  Administered 2021-04-17: 75 ug/min via INTRAVENOUS
  Administered 2021-04-18: 65 ug/min via INTRAVENOUS
  Administered 2021-04-18: 60 ug/min via INTRAVENOUS
  Administered 2021-04-18: 25 ug/min via INTRAVENOUS
  Filled 2021-04-17 (×7): qty 250

## 2021-04-17 MED ORDER — EPHEDRINE 5 MG/ML INJ
INTRAVENOUS | Status: AC
  Filled 2021-04-17: qty 10

## 2021-04-17 MED ORDER — ONDANSETRON HCL 4 MG PO TABS: 4.0000 mg | ORAL_TABLET | Freq: Four times a day (QID) | ORAL | Status: DC | PRN

## 2021-04-17 MED ORDER — PHENYLEPHRINE HCL-NACL 10-0.9 MG/250ML-% IV SOLN: 0.0000 ug/min | INTRAVENOUS | Status: DC

## 2021-04-17 MED ORDER — LIDOCAINE HCL (CARDIAC) PF 100 MG/5ML IV SOSY
PREFILLED_SYRINGE | INTRAVENOUS | Status: DC | PRN
  Administered 2021-04-17: 40 mg via INTRAVENOUS

## 2021-04-17 MED ORDER — ORAL CARE MOUTH RINSE
15.0000 mL | Freq: Two times a day (BID) | OROMUCOSAL | Status: DC
  Administered 2021-04-17 – 2021-04-22 (×9): 15 mL via OROMUCOSAL

## 2021-04-17 MED ORDER — PROPOFOL 500 MG/50ML IV EMUL
INTRAVENOUS | Status: DC | PRN
  Administered 2021-04-17: 25 ug/kg/min via INTRAVENOUS

## 2021-04-17 MED ORDER — SODIUM CHLORIDE 0.9 % IV SOLN
INTRAVENOUS | Status: DC | PRN
  Administered 2021-04-17: 30 ug/min via INTRAVENOUS

## 2021-04-17 MED ORDER — CEFAZOLIN SODIUM-DEXTROSE 2-4 GM/100ML-% IV SOLN
2.0000 g | Freq: Three times a day (TID) | INTRAVENOUS | Status: AC
  Administered 2021-04-18 (×2): 2 g via INTRAVENOUS
  Filled 2021-04-17 (×3): qty 100

## 2021-04-17 MED ORDER — METHOCARBAMOL 1000 MG/10ML IJ SOLN
500.0000 mg | Freq: Four times a day (QID) | INTRAVENOUS | Status: DC | PRN
  Filled 2021-04-17: qty 5

## 2021-04-17 MED ORDER — BUPIVACAINE HCL (PF) 0.5 % IJ SOLN
INTRAMUSCULAR | Status: DC | PRN
  Administered 2021-04-17: 2.4 mL via INTRATHECAL

## 2021-04-17 MED ORDER — FLEET ENEMA 7-19 GM/118ML RE ENEM: 1.0000 | ENEMA | Freq: Once | RECTAL | Status: DC | PRN

## 2021-04-17 MED ORDER — TRAMADOL HCL 50 MG PO TABS
50.0000 mg | ORAL_TABLET | Freq: Four times a day (QID) | ORAL | Status: DC | PRN
  Administered 2021-04-19 – 2021-04-20 (×2): 50 mg via ORAL
  Filled 2021-04-17 (×2): qty 1

## 2021-04-17 MED ORDER — ONDANSETRON HCL 4 MG/2ML IJ SOLN
INTRAMUSCULAR | Status: DC | PRN
  Administered 2021-04-17: 4 mg via INTRAVENOUS

## 2021-04-17 MED ORDER — SENNA 8.6 MG PO TABS
1.0000 | ORAL_TABLET | Freq: Two times a day (BID) | ORAL | Status: DC
  Administered 2021-04-17 – 2021-04-20 (×6): 8.6 mg via ORAL
  Filled 2021-04-17 (×6): qty 1

## 2021-04-17 MED ORDER — METOCLOPRAMIDE HCL 5 MG PO TABS
5.0000 mg | ORAL_TABLET | Freq: Three times a day (TID) | ORAL | Status: DC | PRN
  Filled 2021-04-17: qty 2

## 2021-04-17 MED ORDER — DONEPEZIL HCL 5 MG PO TABS
10.0000 mg | ORAL_TABLET | Freq: Every day | ORAL | Status: DC
  Administered 2021-04-17 – 2021-04-19 (×3): 10 mg via ORAL
  Filled 2021-04-17 (×5): qty 2

## 2021-04-17 MED ORDER — CHLORHEXIDINE GLUCONATE CLOTH 2 % EX PADS
6.0000 | MEDICATED_PAD | Freq: Every day | CUTANEOUS | Status: DC
  Administered 2021-04-17 – 2021-04-20 (×4): 6 via TOPICAL

## 2021-04-17 MED ORDER — METOCLOPRAMIDE HCL 5 MG/ML IJ SOLN: 5.0000 mg | Freq: Three times a day (TID) | INTRAMUSCULAR | Status: DC | PRN

## 2021-04-17 MED ORDER — METHOCARBAMOL 500 MG PO TABS
500.0000 mg | ORAL_TABLET | Freq: Four times a day (QID) | ORAL | Status: DC | PRN
  Filled 2021-04-17: qty 1

## 2021-04-17 MED ORDER — BUPIVACAINE HCL (PF) 0.5 % IJ SOLN
INTRAMUSCULAR | Status: AC
  Filled 2021-04-17: qty 10

## 2021-04-17 MED ORDER — VITAMIN D3 1.25 MG (50000 UT) PO CAPS
1.0000 | ORAL_CAPSULE | ORAL | Status: DC
  Filled 2021-04-17: qty 1

## 2021-04-17 MED ORDER — ENOXAPARIN SODIUM 40 MG/0.4ML IJ SOSY
40.0000 mg | PREFILLED_SYRINGE | INTRAMUSCULAR | Status: DC
  Administered 2021-04-18 – 2021-04-21 (×4): 40 mg via SUBCUTANEOUS
  Filled 2021-04-17 (×4): qty 0.4

## 2021-04-17 MED ORDER — BISACODYL 10 MG RE SUPP: 10.0000 mg | Freq: Every day | RECTAL | Status: DC | PRN

## 2021-04-17 MED ORDER — MAGNESIUM HYDROXIDE 400 MG/5ML PO SUSP: 30.0000 mL | Freq: Every day | ORAL | Status: DC | PRN

## 2021-04-17 MED ORDER — CEFAZOLIN SODIUM-DEXTROSE 2-4 GM/100ML-% IV SOLN
2.0000 g | INTRAVENOUS | Status: AC
  Administered 2021-04-17: 2 g via INTRAVENOUS

## 2021-04-17 MED ORDER — CEFAZOLIN SODIUM-DEXTROSE 2-4 GM/100ML-% IV SOLN
INTRAVENOUS | Status: AC
  Filled 2021-04-17: qty 100

## 2021-04-17 MED ORDER — SODIUM CHLORIDE 0.9 % IV SOLN: INTRAVENOUS | Status: DC

## 2021-04-17 MED ORDER — SODIUM CHLORIDE 0.9 % IV SOLN: Freq: Once | INTRAVENOUS | Status: AC

## 2021-04-17 MED ORDER — PHENYLEPHRINE HCL (PRESSORS) 10 MG/ML IV SOLN
INTRAVENOUS | Status: DC | PRN
  Administered 2021-04-17 (×5): 100 ug via INTRAVENOUS

## 2021-04-17 MED ORDER — DOCUSATE SODIUM 100 MG PO CAPS
100.0000 mg | ORAL_CAPSULE | Freq: Two times a day (BID) | ORAL | Status: DC
  Administered 2021-04-17 – 2021-04-20 (×6): 100 mg via ORAL
  Filled 2021-04-17 (×6): qty 1

## 2021-04-17 MED ORDER — VITAMIN D3 1.25 MG (50000 UT) PO CAPS: 1.0000 | ORAL_CAPSULE | ORAL | Status: DC

## 2021-04-17 MED ORDER — DIPHENHYDRAMINE HCL 12.5 MG/5ML PO ELIX
12.5000 mg | ORAL_SOLUTION | ORAL | Status: DC | PRN
Start: 2021-04-17 — End: 2021-04-23
  Filled 2021-04-17: qty 10

## 2021-04-17 MED ORDER — PROPOFOL 10 MG/ML IV BOLUS
INTRAVENOUS | Status: DC | PRN
  Administered 2021-04-17 (×2): 20 mg via INTRAVENOUS

## 2021-04-17 SURGICAL SUPPLY — 44 items
BIT DRILL 4.3MMS DISTAL GRDTED (BIT) ×1 IMPLANT
BNDG COHESIVE 4X5 TAN STRL (GAUZE/BANDAGES/DRESSINGS) IMPLANT
BNDG COHESIVE 6X5 TAN STRL LF (GAUZE/BANDAGES/DRESSINGS) IMPLANT
CHLORAPREP W/TINT 26 (MISCELLANEOUS) ×4 IMPLANT
CORTICAL BONE SCR 5.0MM X 46MM (Screw) ×2 IMPLANT
COVER WAND RF STERILE (DRAPES) ×2 IMPLANT
DRAPE 3/4 80X56 (DRAPES) ×2 IMPLANT
DRAPE C-ARMOR (DRAPES) ×2 IMPLANT
DRILL 4.3MMS DISTAL GRADUATED (BIT) ×2
DRSG MEPILEX SACRM 8.7X9.8 (GAUZE/BANDAGES/DRESSINGS) ×2 IMPLANT
DRSG OPSITE POSTOP 3X4 (GAUZE/BANDAGES/DRESSINGS) IMPLANT
DRSG OPSITE POSTOP 4X6 (GAUZE/BANDAGES/DRESSINGS) IMPLANT
ELECT CAUTERY BLADE 6.4 (BLADE) ×2 IMPLANT
ELECT REM PT RETURN 9FT ADLT (ELECTROSURGICAL) ×2
ELECTRODE REM PT RTRN 9FT ADLT (ELECTROSURGICAL) ×1 IMPLANT
GAUZE SPONGE 4X4 12PLY STRL (GAUZE/BANDAGES/DRESSINGS) ×2 IMPLANT
GLOVE SURG ENC MOIS LTX SZ8 (GLOVE) ×4 IMPLANT
GLOVE SURG UNDER LTX SZ8 (GLOVE) ×2 IMPLANT
GOWN STRL REUS W/ TWL LRG LVL3 (GOWN DISPOSABLE) ×1 IMPLANT
GOWN STRL REUS W/ TWL XL LVL3 (GOWN DISPOSABLE) ×1 IMPLANT
GOWN STRL REUS W/TWL LRG LVL3 (GOWN DISPOSABLE) ×1
GOWN STRL REUS W/TWL XL LVL3 (GOWN DISPOSABLE) ×1
GUIDEPIN VERSANAIL DSP 3.2X444 (ORTHOPEDIC DISPOSABLE SUPPLIES) ×2 IMPLANT
GUIDEWIRE BALL NOSE 100CM (WIRE) ×2 IMPLANT
HIP FRAC NAIL LAG SCR 10.5X100 (Orthopedic Implant) ×1 IMPLANT
MANIFOLD NEPTUNE II (INSTRUMENTS) ×2 IMPLANT
MAT ABSORB  FLUID 56X50 GRAY (MISCELLANEOUS) ×1
MAT ABSORB FLUID 56X50 GRAY (MISCELLANEOUS) ×1 IMPLANT
NAIL HIP FRACT LT 130D 11X400 (Nail) ×2 IMPLANT
NEEDLE FILTER BLUNT 18X 1/2SAF (NEEDLE)
NEEDLE FILTER BLUNT 18X1 1/2 (NEEDLE) IMPLANT
NEEDLE HYPO 22GX1.5 SAFETY (NEEDLE) ×2 IMPLANT
NS IRRIG 500ML POUR BTL (IV SOLUTION) ×2 IMPLANT
PACK HIP COMPR (MISCELLANEOUS) ×2 IMPLANT
SCREW CANN THRD AFF 10.5X100 (Orthopedic Implant) ×1 IMPLANT
SCREW CORTICL BON 5.0MM X 46MM (Screw) ×1 IMPLANT
STAPLER SKIN PROX 35W (STAPLE) ×2 IMPLANT
STRAP SAFETY 5IN WIDE (MISCELLANEOUS) ×2 IMPLANT
SUT VIC AB 0 CT1 36 (SUTURE) ×2 IMPLANT
SUT VIC AB 1 CT1 36 (SUTURE) ×2 IMPLANT
SUT VIC AB 2-0 CT1 (SUTURE) ×2 IMPLANT
SYR 10ML LL (SYRINGE) ×2 IMPLANT
SYR 30ML LL (SYRINGE) ×2 IMPLANT
TAPE MICROFOAM 4IN (TAPE) IMPLANT

## 2021-04-17 NOTE — ED Notes (Signed)
Pt changed, clean of wet brief, new linens and warm blankets provided. PurWick placed.

## 2021-04-17 NOTE — Progress Notes (Signed)
Received call from PACU Patient received spinal anesthesia following reduction and internal fixation of her left hip fracture. Postoperatively she was hypotensive and received Neo-Synephrine and ephedrine with transient improvement in her blood pressure.  She is currently hypotensive and has been started on Neo-Synephrine drip Patient will be transferred to stepdown.

## 2021-04-17 NOTE — H&P (Signed)
History and Physical    Cynthia Hart OVF:[id number] DOB: 1927/01/09 DOA: 2021-05-02  [facility]   Patient coming from: [facility]  I have personally briefly reviewed patient's old medical records in [place]  Most of the history is obtained from the ER notes as patient is unable to provide any history.   Chief Complaint: Status post fall  HPI: Cynthia Hart is a 87 y.o. female with medical history significant for dementia, hypertension, diabetes mellitus who was brought into the ER for evaluation of left hip pain after an unwitnessed fall.  Left leg is noted to be shortened and externally rotated. Patient is lying in bed and appears comfortable and is unable to give any history at this time due to her underlying dementia. I am also unable to do review of systems on this patient Labs show sodium 136, potassium 3.8, chloride 100, bicarb 29, glucose 139, BUN 8, creatinine 0.50, calcium 8.3, white count 13.1, hemoglobin 11.5, hematocrit 34.4, MCV 101.8, RDW 13.2, platelet count 169, PT 13.3, INR 1.0 Chest x-ray reviewed by me shows no acute cardiopulmonary disease. X-ray of the left hip shows new left intratrochanteric femoral fracture. CT scan of the head without contrast shows no acute or traumatic finding. Generalized atrophy. Chronic small-vessel change of the white matter. 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 Twelve-lead EKG reviewed by me shows sinus rhythm, with PVCs and incomplete left bundle branch block    ED Course: Patient is a 87 year old female with a history of dementia who was brought into the ER for evaluation of left hip pain following a fall.  X-ray shows a left intertrochanteric femoral fracture.   Orthopedic surgery was consulted by the ER physician. Patient will be admitted to the hospital for further evaluation.   Review of Systems: As per HPI otherwise all other systems reviewed and negative.    Past Medical History:  Diagnosis  Date  . Diabetes ([place])   . Hypertension   . Osteoarthritis   . Osteoporosis   . Postmenopausal   . Tumor   . Vitamin D deficiency     Past Surgical History:  Procedure Laterality Date  . CATARACT EXTRACTION    . CHOLECYSTECTOMY    . INTRAMEDULLARY (IM) NAIL INTERTROCHANTERIC Right 05/22/2019   Procedure: INTRAMEDULLARY (IM) NAIL INTERTROCHANTRIC;  Surgeon: Hessie Knows, MD;  Location: [facility] ORS;  Service: Orthopedics;  Laterality: Right;     reports that she has never smoked. She has never used smokeless tobacco. She reports previous alcohol use. She reports that she does not use drugs.  Allergies  Allergen Reactions  . Alendronate Other (See Comments)    From Fosamax-Stomach Pain  . Codeine     Nausea   . Ibandronic Acid Other (See Comments)    From Boniva-Stomach Pain  . Penicillins     Nausea,rash and vomiting     Family History  Problem Relation Age of Onset  . Breast cancer Mother   . Dementia Brother   . Heart disease Maternal Grandmother   . Hypertension Paternal Grandfather       Prior to Admission medications   Medication Sig Start Date End Date Taking? Authorizing Provider  acetaminophen (TYLENOL) 500 MG tablet Take 1,000 mg by mouth 2 (two) times daily.    [provider]  azithromycin (ZITHROMAX Z-PAK) 250 MG tablet Take 2 tablets (500 mg) on  Day 1,  followed by 1 tablet (250 mg) once daily on Days 2 through 5. Patient not taking: No sig  reported 03/25/20   Earleen Newport, MD  Cholecalciferol (VITAMIN D3) 50000 units CAPS Take 1 capsule by mouth every 14 (fourteen) days.    [provider]  donepezil (ARICEPT) 10 MG tablet Take 10 mg by mouth at bedtime.    [provider]  gabapentin (NEURONTIN) 100 MG capsule Take 100 mg by mouth 3 (three) times daily.    [provider]    Physical Exam: Vitals:   04/10/2021 1022 04/16/2021 1027  BP: 119/67   Pulse: 75   Resp: 18   Temp: 97.9 F (36.6 C)   TempSrc: Oral    SpO2: 100%   Weight:  55 kg  Height:  5\' 8"  (1.727 m)     Vitals:   04/09/2021 1022 04/06/2021 1027  BP: 119/67   Pulse: 75   Resp: 18   Temp: 97.9 F (36.6 C)   TempSrc: Oral   SpO2: 100%   Weight:  55 kg  Height:  5\' 8"  (1.727 m)   Constitutional: Alert and oriented x 1.  Only to person. Not in any apparent distress HEENT:      Head: Normocephalic and atraumatic.         Eyes: PERLA, EOMI, Conjunctivae are normal. Sclera is non-icteric.       Mouth/Throat: Mucous membranes are moist.       Neck: Supple with no signs of meningismus. Cardiovascular: Regular rate and rhythm. No murmurs, gallops, or rubs. 2+ symmetrical distal pulses are present . No JVD. No LE edema Respiratory: Respiratory effort normal .Lungs sounds clear bilaterally. No wheezes, crackles, or rhonchi.  Gastrointestinal: Soft, non tender, and non distended with positive bowel sounds.  Genitourinary: No CVA tenderness. Musculoskeletal:  Decreased range of motion left hip, shortening of the left lower extremity with external rotation Neurologic:  Face is symmetric. Moving all extremities. No gross focal neurologic deficits  Skin: Skin is warm, dry.  No rash or ulcers Psychiatric: Mood and affect are normal   Labs on Admission: I have personally reviewed following labs and imaging studies  CBC: Recent Labs  Lab 04/15/21 1014 04/21/2021 1054  WBC 6.6 13.1*  NEUTROABS 4.6 11.3*  HGB 12.1 11.5*  HCT 36.5 34.4*  MCV 101.1* 101.8*  PLT 159 709   Basic Metabolic Panel: Recent Labs  Lab 04/15/21 1014 04/02/2021 1054  NA 137 136  K 4.1 3.8  CL 100 100  CO2 30 29  GLUCOSE 96 139*  BUN 15 15  CREATININE 0.52 0.50  CALCIUM 8.4* 8.3*   GFR: Estimated Creatinine Clearance: 39 mL/min (by C-G formula based on SCr of 0.5 mg/dL). Liver Function Tests: Recent Labs  Lab 04/15/21 1014  AST 18  ALT 9  ALKPHOS 36*  BILITOT 0.7  PROT 7.0  ALBUMIN 4.0   No results for input(s): LIPASE, AMYLASE in the last  168 hours. No results for input(s): AMMONIA in the last 168 hours. Coagulation Profile: Recent Labs  Lab 04/23/2021 1054  INR 1.0   Cardiac Enzymes: No results for input(s): CKTOTAL, CKMB, CKMBINDEX, TROPONINI in the last 168 hours. BNP (last 3 results) No results for input(s): PROBNP in the last 8760 hours. HbA1C: No results for input(s): HGBA1C in the last 72 hours. CBG: No results for input(s): GLUCAP in the last 168 hours. Lipid Profile: No results for input(s): CHOL, HDL, LDLCALC, TRIG, CHOLHDL, LDLDIRECT in the last 72 hours. Thyroid Function Tests: No results for input(s): TSH, T4TOTAL, FREET4, T3FREE, THYROIDAB in the last 72 hours. Anemia Panel:  No results for input(s): VITAMINB12, FOLATE, FERRITIN, TIBC, IRON, RETICCTPCT in the last 72 hours. Urine analysis:    Component Value Date/Time   COLORURINE YELLOW (A) 04/15/2021 1014   APPEARANCEUR HAZY (A) 04/15/2021 1014   LABSPEC 1.013 04/15/2021 1014   PHURINE 8.0 04/15/2021 [place] 04/15/2021 1014   [place] 04/15/2021 [place] 04/15/2021 [place] 04/15/2021 [place] 04/15/2021 1014   NITRITE NEGATIVE 04/15/2021 [place] 04/15/2021 1014    Radiological Exams on Admission: DG Chest 1 View  Result Date: 04/23/2021 CLINICAL DATA:  Left hip fracture, initial encounter EXAM: CHEST  1 VIEW COMPARISON:  05/22/2019 FINDINGS: Cardiac shadow is enlarged but stable. Aortic calcifications are again seen. Lungs are well aerated bilaterally. No focal infiltrate or effusion is seen. No bony abnormality is seen. Stable scoliosis concave to the left is noted. IMPRESSION: No acute abnormality noted. Electronically Signed   By: Inez Catalina M.D.   On: 03/26/2021 12:21   CT Head Wo Contrast  Result Date: 04/06/2021 CLINICAL DATA:  Unwitnessed fall.  Dementia. EXAM: CT HEAD WITHOUT CONTRAST TECHNIQUE: Contiguous axial images were obtained from  the base of the skull through the vertex without intravenous contrast. COMPARISON:  04/15/2021 FINDINGS: Brain: Generalized age related atrophy. Mild chronic small-vessel ischemic changes of the deep white matter. 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 No sign of acute infarction, mass lesion, hemorrhage, hydrocephalus or extra-axial collection. Vascular: There is atherosclerotic calcification of the major vessels at the base of the brain. Skull: No skull fracture. Sinuses/Orbits: Clear/normal Other: None IMPRESSION: No acute or traumatic finding. Generalized atrophy. Chronic small-vessel change of the white matter. 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 Electronically Signed   By: Nelson Chimes M.D.   On: 04/07/2021 11:52   DG Hip Unilat W or Wo Pelvis 2-3 Views Left  Result Date: 04/05/2021 CLINICAL DATA:  Left hip pain following fall, initial encounter EXAM: DG HIP (WITH OR WITHOUT PELVIS) 2-3V LEFT COMPARISON:  04/15/2021 FINDINGS: Pelvic ring is intact. Postsurgical changes in the right femur are noted. New intratrochanteric fracture in the proximal left femur is noted with impaction and angulation at the fracture site. IMPRESSION: New left intratrochanteric femoral fracture. Electronically Signed   By: Inez Catalina M.D.   On: 04/05/2021 12:15     Assessment/Plan Principal Problem:   Femur fracture, left (HCC) Active Problems:   Hypertension   Dementia without behavioral disturbance (HCC)   Chronic respiratory failure (HCC)     Left intratrochanteric femur fracture Following an unwitnessed fall Left leg is shortened and externally rotated Immobilize left lower extremity Pain control and muscle relaxants Consult orthopedic surgery    Dementia without behavioral disturbances Continue Aricept Patient will require assistance with activities of daily living    Chronic respiratory failure Secondary to pulmonary hypertension Continue oxygen supplementation at 1.5 L to  maintain pulse oximetry greater than 92%  DVT prophylaxis: SCD Code Status: full code Family Communication: Called patient's daughter Jerilynn Birkenhead, unable to leave voicemail awaiting callback Disposition Plan: Back to previous home environment Consults called: Orthopedic surgery Status: At the time of admission, it appears that the appropriate admission status for this patient is inpatient. This is judged to be reasonable and necessary in order to provide the required intensity of service to ensure the patient's safety given the presenting symptoms, physical exam findings, and initial radiographic and laboratory data in the context of their comorbid  conditions. Patient requires inpatient status due to high intensity of service, high risk for further deterioration and high frequency of surveillance required    Tekeshia Klahr MD [facility]     2021-04-21, 1:05 PM

## 2021-04-17 NOTE — Anesthesia Procedure Notes (Signed)
Spinal  Patient location during procedure: OR Start time: 04/01/2021 5:10 PM End time: 04/12/2021 5:14 PM Reason for block: surgical anesthesia Staffing Anesthesiologist: Arita Miss, MD Resident/CRNA: Jonna Clark, CRNA Other anesthesia staff: Levert Feinstein, RN Preanesthetic Checklist Completed: patient identified, IV checked, site marked, risks and benefits discussed, surgical consent, monitors and equipment checked, pre-op evaluation and timeout performed Spinal Block Patient position: left lateral decubitus Prep: Betadine Patient monitoring: heart rate, continuous pulse ox, blood pressure and cardiac monitor Approach: midline Location: L4-5 Injection technique: single-shot Needle Needle type: Quincke  Needle gauge: 22 G Needle length: 9 cm Assessment Events: CSF return Additional Notes Negative paresthesia. Negative blood return. Positive free-flowing CSF. Expiration date of kit checked and confirmed. Patient tolerated procedure well, without complications.

## 2021-04-17 NOTE — ED Provider Notes (Signed)
[facility] Emergency Department Provider Note  ____________________________________________  Time seen: Approximately 12:12 PM  I have reviewed the triage vital signs and the nursing notes.   HISTORY  Chief Complaint Fall and Hip Pain  Level 5 Caveat: Portions of the History and Physical including HPI and review of systems are unable to be completely obtained due to patient chronic dementia   HPI Cynthia Hart is a 87 y.o. female with a history of diabetes hypertension and dementia who is brought to the ED complaining of left hip pain after a unwitnessed fall.   Patient also think she hit her head and complains of some headache.  No neck pain.  No paresthesias or motor weakness.  Does not take blood thinners.  Last oral intake was last night.     Past Medical History:  Diagnosis Date  . Diabetes ([place])   . Hypertension   . Osteoarthritis   . Osteoporosis   . Postmenopausal   . Tumor   . Vitamin D deficiency      Patient Active Problem List   Diagnosis Date Noted  . Ileus ([place]) 05/31/2019  . Urinary retention 05/30/2019  . Hip fracture ([place]) 05/22/2019  . Pre-operative cardiovascular examination 09/05/2018  . 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 Moderate to severe mitral regurgitation 09/05/2018  . Pulmonary hypertension, unspecified ([place]) 09/05/2018  . Postoperative examination 08/14/2018  . 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 Heart murmur 08/11/2018  . Dysphagia 08/11/2018     Past Surgical History:  Procedure Laterality Date  . CATARACT EXTRACTION    . CHOLECYSTECTOMY    . INTRAMEDULLARY (IM) NAIL INTERTROCHANTERIC Right 05/22/2019   Procedure: INTRAMEDULLARY (IM) NAIL INTERTROCHANTRIC;  Surgeon: Hessie Knows, MD;  Location: [facility] ORS;  Service: Orthopedics;  Laterality: Right;     Prior to Admission medications   Medication Sig Start Date End Date Taking? Authorizing Provider  acetaminophen (TYLENOL) 500 MG  tablet Take 1,000 mg by mouth 2 (two) times daily.    [provider]  azithromycin (ZITHROMAX Z-PAK) 250 MG tablet Take 2 tablets (500 mg) on  Day 1,  followed by 1 tablet (250 mg) once daily on Days 2 through 5. Patient not taking: No sig reported 03/25/20   Earleen Newport, MD  Cholecalciferol (VITAMIN D3) 50000 units CAPS Take 1 capsule by mouth every 14 (fourteen) days.    [provider]  donepezil (ARICEPT) 10 MG tablet Take 10 mg by mouth at bedtime.    [provider]  gabapentin (NEURONTIN) 100 MG capsule Take 100 mg by mouth 3 (three) times daily.    [provider]     Allergies Alendronate, Codeine, Ibandronic acid, and Penicillins   Family History  Problem Relation Age of Onset  . Breast cancer Mother   . Dementia Brother   . Heart disease Maternal Grandmother   . Hypertension Paternal Grandfather     Social History Social History   Tobacco Use  . Smoking status: Never Smoker  . Smokeless tobacco: Never Used  Substance Use Topics  . Alcohol use: Not Currently  . Drug use: Never    Review of Systems  Constitutional:   No fever or chills.  ENT:   No sore throat. No rhinorrhea. Cardiovascular:   No chest pain or syncope. Respiratory:   No dyspnea or cough. Gastrointestinal:   Negative for abdominal pain, vomiting and diarrhea.  Musculoskeletal:   Left hip pain as above All other systems reviewed and are negative except as documented  above in ROS and HPI.  ____________________________________________   PHYSICAL EXAM:  VITAL SIGNS: ED Triage Vitals  Enc Vitals Group     BP 04/16/2021 1022 119/67     Pulse Rate 04/12/2021 1022 75     Resp 04/15/2021 1022 18     Temp 03/29/2021 1022 97.9 F (36.6 C)     Temp Source 04/18/2021 1022 Oral     SpO2 04/16/2021 1022 100 %     Weight 04/13/2021 1027 121 lb 4.1 oz (55 kg)     Height 04/07/2021 1027 5\' 8"  (1.727 m)     Head Circumference --      Peak Flow --      Pain Score --       Pain Loc --      Pain Edu? --      Excl. in [place]? --     Vital signs reviewed, nursing assessments reviewed.   Constitutional:   Alert and oriented to person and place. Non-toxic appearance. Eyes:   Conjunctivae are normal. EOMI. PERRL. ENT      Head:   Normocephalic and atraumatic.      Nose:   Normal      Mouth/Throat:   Normal.      Neck:   No meningismus. Full ROM. Hematological/Lymphatic/Immunilogical:   No cervical lymphadenopathy. Cardiovascular:   RRR. Symmetric bilateral radial and DP pulses.  No murmurs. Cap refill less than 2 seconds. Respiratory:   Normal respiratory effort without tachypnea/retractions. Breath sounds are clear and equal bilaterally. No wheezes/rales/rhonchi. Gastrointestinal:   Soft and nontender. Non distended. There is no CVA tenderness.  No rebound, rigidity, or guarding. Genitourinary:   deferred Musculoskeletal:   There is shortening and external rotation of the left lower extremity with hip tenderness.  Other long bones and joints stable and nontender.  No C-spine tenderness. Neurologic:   Normal speech and language.  Motor grossly intact. No acute focal neurologic deficits are appreciated.  Skin:    Skin is warm, dry and intact. No rash noted.  No petechiae, purpura, or bullae.  ____________________________________________    LABS (pertinent positives/negatives) (all labs ordered are listed, but only abnormal results are displayed) Labs Reviewed  BASIC METABOLIC PANEL - Abnormal; Notable for the following components:      Result Value   Glucose, Bld 139 (*)    Calcium 8.3 (*)    All other components within normal limits  CBC WITH DIFFERENTIAL/PLATELET - Abnormal; Notable for the following components:   WBC 13.1 (*)    RBC 3.38 (*)    Hemoglobin 11.5 (*)    HCT 34.4 (*)    MCV 101.8 (*)    Neutro Abs 11.3 (*)    Lymphs Abs 0.5 (*)    Monocytes Absolute 1.2 (*)    All other components within normal limits  RESP PANEL BY RT-PCR (FLU A&B,  COVID) ARPGX2  PROTIME-INR  TYPE AND SCREEN   ____________________________________________   EKG Interpreted by me Sinus rhythm rate of 75, normal axis and intervals.  Normal QRS and ST segments.  Left bundle branch block with associated T wave inversions, nonspecific.  1 PVC on the strip.   ____________________________________________    RADIOLOGY  CT Head Wo Contrast  Result Date: 04/03/2021 CLINICAL DATA:  Unwitnessed fall.  Dementia. EXAM: CT HEAD WITHOUT CONTRAST TECHNIQUE: Contiguous axial images were obtained from the base of the skull through the vertex without intravenous contrast. COMPARISON:  04/15/2021 FINDINGS: Brain: Generalized age related atrophy. Mild chronic small-vessel  ischemic changes of the deep white matter. 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 No sign of acute infarction, mass lesion, hemorrhage, hydrocephalus or extra-axial collection. Vascular: There is atherosclerotic calcification of the major vessels at the base of the brain. Skull: No skull fracture. Sinuses/Orbits: Clear/normal Other: None IMPRESSION: No acute or traumatic finding. Generalized atrophy. Chronic small-vessel change of the white matter. 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 Electronically Signed   By: Nelson Chimes M.D.   On: 03/27/2021 11:52    ____________________________________________   PROCEDURES Procedures  ____________________________________________  DIFFERENTIAL DIAGNOSIS   Hip fracture, intracranial hemorrhage, electrolyte abnormality, dehydration  CLINICAL IMPRESSION / ASSESSMENT AND PLAN / ED COURSE  Medications ordered in the ED: Medications - No data to display  Pertinent labs & imaging results that were available during my care of the patient were reviewed by me and considered in my medical decision making (see chart for details).  Roslind Difonzo was evaluated in Emergency Department on 04/14/2021 for the symptoms described in the history of  present illness. She was evaluated in the context of the global COVID-19 pandemic, which necessitated consideration that the patient might be at risk for infection with the SARS-CoV-2 virus that causes COVID-19. Institutional protocols and algorithms that pertain to the evaluation of patients at risk for COVID-19 are in a state of rapid change based on information released by regulatory bodies including the CDC and federal and state organizations. These policies and algorithms were followed during the patient's care in the ED.   Patient presents with clinically apparent left hip fracture.  Will check x-rays, CT head for trauma work-up, check labs.  Clinical Course as of 03/29/2021 1212  Mon Apr 17, 2021  1149 Hip x-ray viewed interpreted by me, shows a comminuted intertrochanteric fracture of the left femur.  Will discuss with orthopedics and plan to admit. [PS]    Clinical Course User Index [PS] Carrie Mew, MD    ----------------------------------------- 12:16 PM on 04/21/2021 -----------------------------------------  Discussed with Dr. Roland Rack who can plan to take patient to the OR for surgical repair this afternoon.  She has been n.p.o. and will stay n.p.o.  Pain is increasing again, fentanyl from EMS wearing off.  We will give some Dilaudid.  No other acute issues.  Will contact hospitalist for preop clearance and medical management   ____________________________________________   FINAL CLINICAL IMPRESSION(S) / ED DIAGNOSES    Final diagnoses:  Closed intertrochanteric fracture, left, initial encounter ([place])  Dementia without behavioral disturbance, unspecified dementia type [facility])     ED Discharge Orders    None      Portions of this note were generated with dragon dictation software. Dictation errors may occur despite best attempts at proofreading.   Carrie Mew, MD 04/11/2021 1217

## 2021-04-17 NOTE — Anesthesia Preprocedure Evaluation (Addendum)
Anesthesia Evaluation  Patient identified by MRN, date of birth, ID band Patient awake and Patient confused    Reviewed: Allergy & Precautions, NPO status , Patient's Chart, lab work & pertinent test results  History of Anesthesia Complications Negative for: history of anesthetic complications  Airway Mallampati: III  TM Distance: >3 FB Neck ROM: Limited    Dental no notable dental hx. (+) Poor Dentition   Pulmonary shortness of breath and Long-Term Oxygen Therapy, neg sleep apnea, neg COPD, Patient abstained from smoking.Not current smoker,  Patient has been on 1.5L/min O2 for past two years since rehab from her last hip surgery   Pulmonary exam normal breath sounds clear to auscultation       Cardiovascular Exercise Tolerance: Poor METShypertension, pulmonary hypertension(-) CAD and (-) Past MI (-) dysrhythmias + Valvular Problems/Murmurs MR  Rhythm:Regular Rate:Normal - Systolic murmurs TTE 9562:  - Left ventricle: The cavity size was normal. Wall thickness was  increased in a pattern of mild LVH. Systolic function was normal.  The estimated ejection fraction was in the range of 60% to 65%.  Wall motion was normal; there were no regional wall motion  abnormalities.  - Aortic valve: Mildly calcified annulus. Trileaflet; mildly  thickened leaflets. There was mild regurgitation.  - Mitral valve: Severely thickened leaflets anterior and posterior.  Severe myxomatous degeneration. Prolapse. , involving the  anterior leaflet and the posterior leaflet. There was moderate to  severe regurgitation, with a single jet directed eccentrically  and toward the septum.  - Left atrium: The atrium was severely dilated.  - Tricuspid valve: There was moderate regurgitation.  - Pulmonic valve: There was moderate regurgitation.  - Pulmonary arteries: PA peak pressure: 62 mm Hg (S).   Two years ago cardiologist note excerpt: "I  discussed my findings with the patient at extensive length.  An echocardiogram is significantly abnormal.  She is a frail lady with overall stable condition at low level.  She plans to undergo Zenker's diverticulum surgery and I mentioned to her that she is moderate to high risk for that surgery based on the findings of the echocardiogram.  Also in view of her frail nature and the fact that she is overall asymptomatic with activities of daily living, of which she does little I did not want to subject her to a test like a stress test.  I mentioned to her and her daughter that she is moderate to high risk and she will have to discuss this with her surgeon.  Unfortunately I cannot put her on any medications like a beta-blocker because of blood pressure and heart rate a very borderline.  It would be her decision to accept this risks and to go forward with the surgery if she decides to do so because she tells me that she has no quality of life now because she cannot taste her food and she is being fed with the PEG tube and has multiple other complex issues of constipation amongst."   Neuro/Psych PSYCHIATRIC DISORDERS Dementia negative neurological ROS     GI/Hepatic neg GERD  ,(+)     (-) substance abuse  ,   Endo/Other  diabetes  Renal/GU negative Renal ROS     Musculoskeletal  (+) Arthritis , Osteoarthritis,    Abdominal   Peds  Hematology   Anesthesia Other Findings Past Medical History: No date: Diabetes ([place]) No date: Hypertension No date: Osteoarthritis No date: Osteoporosis No date: Postmenopausal No date: Tumor No date: Vitamin D deficiency  Reproductive/Obstetrics  Anesthesia Physical Anesthesia Plan  ASA: IV  Anesthesia Plan: Spinal   Post-op Pain Management:    Induction: Intravenous  PONV Risk Score and Plan: 3 and Ondansetron, Dexamethasone, Propofol infusion, TIVA and Treatment may vary due to age or medical  condition  Airway Management Planned: Natural Airway  Additional Equipment: None  Intra-op Plan:   Post-operative Plan: Possible Post-op intubation/ventilation  Informed Consent: I have reviewed the patients History and Physical, chart, labs and discussed the procedure including the risks, benefits and alternatives for the proposed anesthesia with the patient or authorized representative who has indicated his/her understanding and acceptance.     Consent reviewed with POA  Plan Discussed with: CRNA and Surgeon  Anesthesia Plan Comments: (Discussed R/B/A of neuraxial anesthesia technique with patient and daughter at bedside: - rare risks of spinal/epidural hematoma, nerve damage, infection - Risk of PDPH - Risk of nausea and vomiting - Risk of conversion to general anesthesia and its associated risks, including sore throat, damage to lips/teeth/oropharynx, and rare risks such as cardiac and respiratory events. They were counseled on being higher risk for anesthesia due to comorbidities: severe pulmonary HTN and severe valvular abnormalities. Patient and daughter was told about increased risk of cardiac and respiratory events, including death. They understand   )        Anesthesia Quick Evaluation

## 2021-04-17 NOTE — Consult Note (Signed)
ORTHOPAEDIC CONSULTATION  REQUESTING PHYSICIAN: Collier Bullock, MD  Chief Complaint:   Left hip pain.  History of Present Illness: Cynthia Hart is a 87 y.o. female with a history of dementia, diabetes, hypertension, and osteoporosis who lives in an assisted living facility.  The patient was in her usual state of health until early this morning when she apparently fell onto her left side in an unwitnessed fall.  She was brought to the emergency room where x-rays demonstrated the above-noted injury.  The patient denies any associated injuries.  She did not strike her head or lose consciousness.  The patient also denies any lightheadedness, dizziness, chest pain, shortness of breath, or other symptoms which may have precipitated her fall, although she does have a history of dementia and is a poor historian.  She is now nearly 2 years status post an intramedullary nailing of a displaced right intertrochanteric hip fracture by Dr. Rudene Christians from which she has done quite well.  Past Medical History:  Diagnosis Date  . Diabetes ([place])   . Hypertension   . Osteoarthritis   . Osteoporosis   . Postmenopausal   . Tumor   . Vitamin D deficiency    Past Surgical History:  Procedure Laterality Date  . CATARACT EXTRACTION    . CHOLECYSTECTOMY    . INTRAMEDULLARY (IM) NAIL INTERTROCHANTERIC Right 05/22/2019   Procedure: INTRAMEDULLARY (IM) NAIL INTERTROCHANTRIC;  Surgeon: Hessie Knows, MD;  Location: [facility] ORS;  Service: Orthopedics;  Laterality: Right;   Social History   Socioeconomic History  . Marital status: Widowed    Spouse name: Not on file  . Number of children: Not on file  . Years of education: Not on file  . Highest education level: Not on file  Occupational History  . Not on file  Tobacco Use  . Smoking status: Never Smoker  . Smokeless tobacco: Never Used  Vaping Use  . Vaping Use: Never used  Substance and Sexual  Activity  . Alcohol use: Not Currently  . Drug use: Never  . Sexual activity: Not on file  Other Topics Concern  . Not on file  Social History Narrative  . Not on file   Social Determinants of Health   Financial Resource Strain: Not on file  Food Insecurity: Not on file  Transportation Needs: Not on file  Physical Activity: Not on file  Stress: Not on file  Social Connections: Not on file   Family History  Problem Relation Age of Onset  . Breast cancer Mother   . Dementia Brother   . Heart disease Maternal Grandmother   . Hypertension Paternal Grandfather    Allergies  Allergen Reactions  . Alendronate Other (See Comments)    From Fosamax-Stomach Pain  . Codeine     Nausea   . Ibandronic Acid Other (See Comments)    From Boniva-Stomach Pain  . Penicillins     Nausea,rash and vomiting    Prior to Admission medications   Medication Sig Start Date End Date Taking? Authorizing Provider  acetaminophen (TYLENOL) 500 MG tablet Take 1,000 mg by mouth 2 (two) times daily.   Yes [provider]  Cholecalciferol (VITAMIN D3) 50000 units CAPS Take 1 capsule by mouth every 14 (fourteen) days.   Yes [provider]  donepezil (ARICEPT) 10 MG tablet Take 10 mg by mouth at bedtime.   Yes [provider]  gabapentin (NEURONTIN) 100 MG capsule Take 100 mg by mouth 3 (three) times daily.   Yes [provider]  azithromycin (ZITHROMAX Z-PAK) 250 MG tablet Take 2 tablets (500 mg) on  Day 1,  followed by 1 tablet (250 mg) once daily on Days 2 through 5. Patient not taking: No sig reported 03/25/20   Earleen Newport, MD   DG Chest 1 View  Result Date: 2021/05/05 CLINICAL DATA:  Left hip fracture, initial encounter EXAM: CHEST  1 VIEW COMPARISON:  05/22/2019 FINDINGS: Cardiac shadow is enlarged but stable. Aortic calcifications are again seen. Lungs are well aerated bilaterally. No focal infiltrate or effusion is seen. No bony abnormality is seen.  Stable scoliosis concave to the left is noted. IMPRESSION: No acute abnormality noted. Electronically Signed   By: Inez Catalina M.D.   On: May 05, 2021 12:21   CT Head Wo Contrast  Result Date: May 05, 2021 CLINICAL DATA:  Unwitnessed fall.  Dementia. EXAM: CT HEAD WITHOUT CONTRAST TECHNIQUE: Contiguous axial images were obtained from the base of the skull through the vertex without intravenous contrast. COMPARISON:  04/15/2021 FINDINGS: Brain: Generalized age related atrophy. Mild chronic small-vessel ischemic changes of the deep white matter. 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 No sign of acute infarction, mass lesion, hemorrhage, hydrocephalus or extra-axial collection. Vascular: There is atherosclerotic calcification of the major vessels at the base of the brain. Skull: No skull fracture. Sinuses/Orbits: Clear/normal Other: None IMPRESSION: No acute or traumatic finding. Generalized atrophy. Chronic small-vessel change of the white matter. 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 Electronically Signed   By: Nelson Chimes M.D.   On: 05-05-2021 11:52   DG Hip Unilat W or Wo Pelvis 2-3 Views Left  Result Date: May 05, 2021 CLINICAL DATA:  Left hip pain following fall, initial encounter EXAM: DG HIP (WITH OR WITHOUT PELVIS) 2-3V LEFT COMPARISON:  04/15/2021 FINDINGS: Pelvic ring is intact. Postsurgical changes in the right femur are noted. New intratrochanteric fracture in the proximal left femur is noted with impaction and angulation at the fracture site. IMPRESSION: New left intratrochanteric femoral fracture. Electronically Signed   By: Inez Catalina M.D.   On: 05/05/2021 12:15    Positive ROS: All other systems have been reviewed and were otherwise negative with the exception of those mentioned in the HPI and as above.  Physical Exam: General:  Alert, no acute distress Psychiatric:  Patient is competent for consent with normal mood and affect   Cardiovascular:  No pedal  edema Respiratory:  No wheezing, non-labored breathing GI:  Abdomen is soft and non-tender Skin:  No lesions in the area of chief complaint Neurologic:  Sensation intact distally Lymphatic:  No axillary or cervical lymphadenopathy  Orthopedic Exam:  Orthopedic examination is limited to the left hip and lower extremity.  The left lower extremity is somewhat shortened and externally rotated as compared to the right.  Skin inspection around the left hip is unremarkable.  No swelling, erythema, ecchymosis, abrasions, or other skin abnormalities are identified.  She has mild tenderness to palpation over the lateral aspect of the left hip.  She has more severe pain with any attempted active or passive motion of the left hip or thigh.  She is neurovascularly intact to the left foot and lower leg.  X-rays:  X-rays of the pelvis and left hip are available for review and have been reviewed by myself.  These films demonstrate a displaced intertrochanteric fracture of the left hip.  No significant degenerative changes of the left hip joint are noted.  No lytic lesions or other acute bony abnormalities are identified.  Assessment: Closed displaced intertrochanteric  fracture of left hip.  Plan: The treatment options, including both surgical and nonsurgical choices, have been discussed in detail with the patient and her daughter, who is the patient's power of attorney and healthcare proxy. The risks (including bleeding, infection, nerve and/or blood vessel injury, persistent or recurrent pain, malunion and/or nonunion, loosening of and/or failure of the components, leg length inequality, need for further surgery, blood clots, strokes, heart attacks or arrhythmias, pneumonia, etc.) and benefits of the surgical procedure were discussed. The patient's daughter states her understanding and agrees to proceed on behalf of the patient.  She agrees to a blood transfusion if necessary. A formal written consent will be  obtained by the nursing staff.  Thank you for asking me to participate in the care of this most pleasant yet unfortunate woman.  I will be happy to follow her with you.   Pascal Lux, MD  Beeper #:  [phone]  03/29/2021 4:27 PM

## 2021-04-17 NOTE — Op Note (Signed)
04/06/2021  7:00 PM  Patient:   Cynthia Hart  Pre-Op Diagnosis:   Closed displaced intertrochanteric fracture, left hip.  Post-Op Diagnosis:   Same  Procedure:   Reduction and internal fixation of left hip fracture with Biomet Affixis TFN nail.  Surgeon:   Pascal Lux, MD  Assistant:   None  Anesthesia:   Spinal  Findings:   As above  Complications:   None  EBL:   300 cc  Fluids:   500 cc crystalloid  UOP:   None  TT:   None  Drains:   None  Closure:   Staples  Implants:   Biomet Affixis 11 x 400 mm TFN with a 100 mm lag screw and a 46 mm distal interlocking screw  Brief Clinical Note:   The patient is a 87 year old female who sustained the above-noted injury earlier this morning when she apparently fell in her room at an assisted living facility. The patient was brought to the emergency room where x-rays demonstrated the above-noted injury. The patient has been cleared medically and presents at this time for reduction and internal fixation of the displaced intertrochanteric left hip fracture.  Procedure:   The patient was brought into the operating room. After adequate spinal anesthesia was obtained, the patient was lain in the supine position on the fracture table. The uninjured leg was placed in a flexed and abducted position while the injured lower extremity was placed in longitudinal traction. The fracture was reduced using longitudinal traction and internal rotation. The adequacy of reduction was verified fluoroscopically in AP and lateral projections and found to be near anatomic. The lateral aspects of the left hip and thigh were prepped with ChloraPrep solution before being draped sterilely. Preoperative antibiotics were administered. A timeout was performed to verify the appropriate surgical site.   The greater trochanter was identified fluoroscopically and an approximately 3 cm incision made about 2-3 fingerbreadths above the tip of the greater trochanter.  The incision was carried down through the subcutaneous tissues to expose the gluteal fascia. This was split the length of the incision, providing access to the tip of the trochanter. Under fluoroscopic guidance, a guidewire was drilled through the tip of the trochanter into the proximal metaphysis to the level of the lesser trochanter. After verifying its position fluoroscopically in AP and lateral projections, it was overreamed with the initial reamer to the depth of the lesser trochanter. A guidewire was passed down through the femoral canal to the supracondylar region. The adequacy of guidewire position was verified fluoroscopically in AP and lateral projections before the length of the guidewire within the canal was measured and found to be 430 mm. Therefore, a 400 mm length nail was selected. The guidewire was overreamed sequentially using the flexible reamers, beginning with a 10.5 mm reamer and progressing to a 12.5 mm reamer. This provided good cortical chatter. The 11 x 400 mm Biomet Affixis TFN rod was selected and advanced to the appropriate depth, as verified fluoroscopically.   The guide system for the lag screw was positioned and advanced through an approximately 2 cm stab incision over the lateral aspect of the proximal femur. The guidewire was drilled up through the trochanteric femoral nail and into the femoral neck to rest within 5 mm of subchondral bone. After verifying its position in the femoral neck and head in both AP and lateral projections, the guidewire was measured and found to be optimally replicated by a 427 mm lag screw. The guidewire was overreamed to  the appropriate depth before the lag screw was inserted and advanced to the appropriate depth as verified fluoroscopically in AP and lateral projections. The fracture was compressed using the appropriate device before the locking screw was advanced, then backed off a quarter turn to set the lag screw. Again the adequacy of hardware  position and fracture reduction was verified fluoroscopically in AP and lateral projections and found to be excellent.  Attention was directed distally. Using the "perfect circle" technique, the leg and fluoroscopy machine were positioned appropriately. An approximately 1.5 cm stab incision was made over the skin at the appropriate point before the drill bit was advanced through the cortex and across the static hole of the nail. The appropriate length of the screw was determined before the 46 mm distal interlocking screw was positioned, then advanced and tightened securely. Again the adequacy of screw position was verified fluoroscopically in AP and lateral projections and found to be excellent.  The wounds were irrigated thoroughly with sterile saline solution before the abductor fascia was reapproximated using #1 Vicryl interrupted sutures. The subcutaneous tissues were closed using 2-0 Vicryl interrupted sutures. The skin was closed using staples. A total of 30 cc of 0.5% Sensorcaine with epinephrine was injected in and around all incisions. Sterile occlusive dressings were applied to all wounds before the patient was transferred back to his/her hospital bed. The patient was then transferred to the recovery room in satisfactory condition after tolerating the procedure well.

## 2021-04-17 NOTE — Transfer of Care (Signed)
Immediate Anesthesia Transfer of Care Note  Patient: Cynthia Hart  Procedure(s) Performed: INTRAMEDULLARY (IM) NAIL INTERTROCHANTRIC (Left )  Patient Location: PACU  Anesthesia Type:Spinal  Level of Consciousness: awake and patient cooperative  Airway & Oxygen Therapy: Patient Spontanous Breathing and Patient connected to face mask oxygen  Post-op Assessment: Report given to RN and Post -op Vital signs reviewed and stable  Post vital signs: Reviewed and stable  Last Vitals:  Vitals Value Taken Time  BP 88/57 04/20/2021 1907  Temp    Pulse 69   Resp 26 04/25/2021 1909  SpO2 100   Vitals shown include unvalidated device data.  Last Pain:  Vitals:   04/16/2021 1621  TempSrc: Temporal  PainSc: 7       Patients Stated Pain Goal: 0 (40/08/67 6195)  Complications: No complications documented.

## 2021-04-17 NOTE — ED Triage Notes (Signed)
Pt to ER from independent living at [facility].  Pt had unwitnessed mechanical fall with noted left leg shortening and rotation.  Pt found by caregiver this AM.  Pt has baseline dementia.

## 2021-04-17 NOTE — Plan of Care (Signed)

## 2021-04-18 ENCOUNTER — Inpatient Hospital Stay: Payer: Medicare PPO

## 2021-04-18 ENCOUNTER — Encounter: Payer: Self-pay | Admitting: Surgery

## 2021-04-18 DIAGNOSIS — E43 Unspecified severe protein-calorie malnutrition: Secondary | ICD-10-CM | POA: Diagnosis present

## 2021-04-18 LAB — CBC
HCT: 22.6 % — ABNORMAL LOW (ref 36.0–46.0)
Hemoglobin: 7.1 g/dL — ABNORMAL LOW (ref 12.0–15.0)
MCH: 32.9 pg (ref 26.0–34.0)
MCHC: 31.4 g/dL (ref 30.0–36.0)
MCV: 104.6 fL — ABNORMAL HIGH (ref 80.0–100.0)
Platelets: 134 10*3/uL — ABNORMAL LOW (ref 150–400)
RBC: 2.16 MIL/uL — ABNORMAL LOW (ref 3.87–5.11)
RDW: 13.2 % (ref 11.5–15.5)
WBC: 12.6 10*3/uL — ABNORMAL HIGH (ref 4.0–10.5)
nRBC: 0 % (ref 0.0–0.2)

## 2021-04-18 LAB — BASIC METABOLIC PANEL
Anion gap: 8 (ref 5–15)
BUN: 18 mg/dL (ref 8–23)
CO2: 24 mmol/L (ref 22–32)
Calcium: 6.9 mg/dL — ABNORMAL LOW (ref 8.9–10.3)
Chloride: 106 mmol/L (ref 98–111)
Creatinine, Ser: 0.47 mg/dL (ref 0.44–1.00)
GFR, Estimated: >60 mL/min (ref >60)
Glucose, Bld: 135 mg/dL — ABNORMAL HIGH (ref 70–99)
Potassium: 3.6 mmol/L (ref 3.5–5.1)
Sodium: 138 mmol/L (ref 135–145)

## 2021-04-18 LAB — PREPARE RBC (CROSSMATCH)

## 2021-04-18 LAB — MRSA PCR SCREENING: MRSA by PCR: NEGATIVE

## 2021-04-18 LAB — HEMOGLOBIN AND HEMATOCRIT, BLOOD
HCT: 22.1 % — ABNORMAL LOW (ref 36.0–46.0)
Hemoglobin: 7.3 g/dL — ABNORMAL LOW (ref 12.0–15.0)

## 2021-04-18 MED ORDER — SODIUM CHLORIDE 0.9% IV SOLUTION: Freq: Once | INTRAVENOUS | Status: AC

## 2021-04-18 MED ORDER — ADULT MULTIVITAMIN W/MINERALS CH
1.0000 | ORAL_TABLET | Freq: Every day | ORAL | Status: DC
  Administered 2021-04-19 – 2021-04-20 (×2): 1 via ORAL
  Filled 2021-04-18 (×2): qty 1

## 2021-04-18 MED ORDER — ENSURE ENLIVE PO LIQD
237.0000 mL | Freq: Two times a day (BID) | ORAL | Status: DC
  Administered 2021-04-18 – 2021-04-20 (×3): 237 mL via ORAL

## 2021-04-18 NOTE — Progress Notes (Signed)
Initial Nutrition Assessment  DOCUMENTATION CODES:   Severe malnutrition in context of social or environmental circumstances  INTERVENTION:   Ensure Enlive po BID, each supplement provides 350 kcal and 20 grams of protein  Magic cup TID with meals, each supplement provides 290 kcal and 9 grams of protein  Dysphagia 3 diet   Pt at high refeed risk; recommend monitor potassium, magnesium and phosphorus labs daily until stable  NUTRITION DIAGNOSIS:   Severe Malnutrition related to social / environmental circumstances (dementia, advanced) as evidenced by severe fat depletion,severe muscle depletion.  GOAL:   Patient will meet greater than or equal to 90% of their needs  MONITOR:   PO intake,Supplement acceptance,Labs,Weight trends,Skin,I & O's  REASON FOR ASSESSMENT:   Consult Assessment of nutrition requirement/status  ASSESSMENT:   87 y.o. female with medical history significant for dementia, hypertension and diabetes mellitus who is admitted with hip fracture after fall now s/p reduction and internal fixation 5/23   Visited pt's room today. Pt unable to provide nutrition related history r/t dementia. RD suspects pt with poor oral intake at baseline as pt is malnourished. RD will add supplements and MVI to help pt meet her estimated needs. Per chart, pt with weight gain pta if her admit weight is correct.    Medications reviewed and include: colace, lovenox, MVI, senokot, D3, cefazolin, neopsynephrine  Labs reviewed: wbc- 12.6(H), Hgb 7.1(L), Hct 22.6(L)  NUTRITION - FOCUSED PHYSICAL EXAM:  Flowsheet Row Most Recent Value  Orbital Region Moderate depletion  Upper Arm Region Severe depletion  Thoracic and Lumbar Region Severe depletion  Buccal Region Moderate depletion  Temple Region Severe depletion  Clavicle Bone Region Severe depletion  Clavicle and Acromion Bone Region Severe depletion  Scapular Bone Region Severe depletion  Dorsal Hand Severe depletion   Patellar Region Severe depletion  Anterior Thigh Region Severe depletion  Posterior Calf Region Severe depletion  Edema (RD Assessment) Mild  Hair Reviewed  Eyes Reviewed  Mouth Reviewed  Skin Reviewed  Nails Reviewed     Diet Order:   Diet Order            Diet regular Room service appropriate? Yes; Fluid consistency: Thin  Diet effective now                EDUCATION NEEDS:   No education needs have been identified at this time  Skin:  Skin Assessment: Reviewed RN Assessment (Incision L hip)  Last BM:  5/22  Height:   Ht Readings from Last 1 Encounters:  04/24/2021 5\' 8"  (1.727 m)    Weight:   Wt Readings from Last 1 Encounters:  04/09/2021 55 kg    Ideal Body Weight:  63.6 kg  BMI:  Body mass index is 18.44 kg/m.  Estimated Nutritional Needs:   Kcal:  1400-1600kcal/day  Protein:  70-80g/day  Fluid:  1.4-1.6L/day  Koleen Distance MS, RD, LDN Please refer to [facility] for RD and/or RD on-call/weekend/after hours pager

## 2021-04-18 NOTE — Anesthesia Postprocedure Evaluation (Signed)
Anesthesia Post Note  Patient: Sport and exercise psychologist  Procedure(s) Performed: INTRAMEDULLARY (IM) NAIL INTERTROCHANTRIC (Left )  Patient location during evaluation: ICU Anesthesia Type: Spinal Level of consciousness: awake and alert and confused Pain management: pain level controlled Vital Signs Assessment: post-procedure vital signs reviewed and stable Respiratory status: spontaneous breathing and respiratory function stable Cardiovascular status: stable Anesthetic complications: no   No complications documented.   Last Vitals:  Vitals:   04/18/21 0830 04/18/21 0845  BP: (!) 108/59 (!) 112/59  Pulse: 83 81  Resp: (!) 25 16  Temp:    SpO2: 96% 95%    Last Pain:  Vitals:   04/18/21 0828  TempSrc:   PainSc: Yolanda Manges

## 2021-04-18 NOTE — Progress Notes (Signed)
[facility] [place] NAME: Cynthia Hart    MR#:  [id number]  DATE OF BIRTH:  1927/04/22  SUBJECTIVE:  patient came in after unwitnessed fall. Today's postop day one left hip fracture repair. Postop developed hypotension required IV phenylephrine. currently blood pressure soft. No family at bedside  REVIEW OF SYSTEMS:   Review of Systems  Unable to perform ROS: Dementia   Tolerating Diet: Tolerating PT:   DRUG ALLERGIES:   Allergies  Allergen Reactions  . Alendronate Other (See Comments)    From Fosamax-Stomach Pain  . Codeine     Nausea   . Ibandronic Acid Other (See Comments)    From Boniva-Stomach Pain  . Penicillins     TOLERATED CEFAZOLIN 04/08/2021 Nausea,rash and vomiting     VITALS:  Blood pressure (!) 91/53, pulse 87, temperature 98.5 F (36.9 C), temperature source Oral, resp. rate (!) 24, height 5\' 8"  (1.727 m), weight 55 kg, SpO2 (!) 84 %.  PHYSICAL EXAMINATION:   Physical Exam  GENERAL:  87 y.o.-year-old patient lying in the bed with no acute distress. Thin frail cachectic LUNGS: Normal breath sounds bilaterally, no wheezing, rales, rhonchi. No use of accessory muscles of respiration.  CARDIOVASCULAR: S1, S2 normal. No murmurs, rubs, or gallops.  ABDOMEN: Soft, nontender, nondistended. Bowel sounds present. No organomegaly or mass.  EXTREMITIES: No cyanosis, clubbing or edema b/l.    PSYCHIATRIC:  patient is alert some baseline confusion SKIN: No obvious rash, lesion, or ulcer.   LABORATORY PANEL:  CBC Recent Labs  Lab 04/18/21 0338  WBC 12.6*  HGB 7.1*  HCT 22.6*  PLT 134*    Chemistries  Recent Labs  Lab 04/15/21 1014 03/27/2021 1054 04/18/21 0338  NA 137   < > 138  K 4.1   < > 3.6  CL 100   < > 106  CO2 30   < > 24  GLUCOSE 96   < > 135*  BUN 15   < > 18  CREATININE 0.52   < > 0.47  CALCIUM 8.4*   < > 6.9*  AST 18  --   --   ALT 9  --   --   ALKPHOS 36*  --   --   BILITOT 0.7  --    --    < > = values in this interval not displayed.   Cardiac Enzymes No results for input(s): TROPONINI in the last 168 hours. RADIOLOGY:  DG Chest 1 View  Result Date: 04/25/2021 CLINICAL DATA:  Left hip fracture, initial encounter EXAM: CHEST  1 VIEW COMPARISON:  05/22/2019 FINDINGS: Cardiac shadow is enlarged but stable. Aortic calcifications are again seen. Lungs are well aerated bilaterally. No focal infiltrate or effusion is seen. No bony abnormality is seen. Stable scoliosis concave to the left is noted. IMPRESSION: No acute abnormality noted. Electronically Signed   By: Inez Catalina M.D.   On: 04/04/2021 12:21   CT Head Wo Contrast  Result Date: 04/15/2021 CLINICAL DATA:  Unwitnessed fall.  Dementia. EXAM: CT HEAD WITHOUT CONTRAST TECHNIQUE: Contiguous axial images were obtained from the base of the skull through the vertex without intravenous contrast. COMPARISON:  04/15/2021 FINDINGS: Brain: Generalized age related atrophy. Mild chronic small-vessel ischemic changes of the deep white matter. 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 No sign of acute infarction, mass lesion, hemorrhage, hydrocephalus or extra-axial collection. Vascular: There is atherosclerotic calcification of the major vessels at the base of the  brain. Skull: No skull fracture. Sinuses/Orbits: Clear/normal Other: None IMPRESSION: No acute or traumatic finding. Generalized atrophy. Chronic small-vessel change of the white matter. 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 Electronically Signed   By: Nelson Chimes M.D.   On: 04/01/2021 11:52   DG HIP OPERATIVE UNILAT WITH PELVIS LEFT  Result Date: 03/29/2021 CLINICAL DATA:  Elective surgery. EXAM: OPERATIVE LEFT HIP (WITH PELVIS IF PERFORMED) TECHNIQUE: Fluoroscopic spot image(s) were submitted for interpretation post-operatively. COMPARISON:  Radiograph earlier today. FINDINGS: Six fluoroscopic spot views of the left hip and femur obtained in the operating room.  Intramedullary nail with trans trochanteric and distal locking screw fixation of intertrochanteric femur fracture. Total fluoroscopy time 30 seconds. Total dose not provided. IMPRESSION: Procedural fluoroscopy for ORIF intertrochanteric femur fracture. Electronically Signed   By: Keith Rake M.D.   On: 04/08/2021 19:21   DG Hip Unilat W or Wo Pelvis 2-3 Views Left  Result Date: 03/31/2021 CLINICAL DATA:  Left hip pain following fall, initial encounter EXAM: DG HIP (WITH OR WITHOUT PELVIS) 2-3V LEFT COMPARISON:  04/15/2021 FINDINGS: Pelvic ring is intact. Postsurgical changes in the right femur are noted. New intratrochanteric fracture in the proximal left femur is noted with impaction and angulation at the fracture site. IMPRESSION: New left intratrochanteric femoral fracture. Electronically Signed   By: Inez Catalina M.D.   On: 03/27/2021 12:15   ASSESSMENT AND PLAN:  Cynthia Hart is a 87 y.o. female with medical history significant for dementia, hypertension, diabetes mellitus who was brought into the ER for evaluation of left hip pain after an unwitnessed fall.  Left leg is noted to be shortened and externally rotated. Patient is lying in bed and appears comfortable and is unable to give any history at this time due to her underlying dementia.  Postoperative hypotension suspect due to volume loss -- up noted reports proximately 300 mL plus -- hemoglobin 11-- 7.1-- defer to orthopedic for blood transfusion -- patient was on IV phenylephrine-- currently off. Blood pressure remains systolic in the 69G. Continue to monitor resume IV pressers if needed.  Displaced left intertrochanteric femur fracture status post unwitnessed mechanical fall -- postop day #1 reduction and internal fixation of left hip fracture -- PRN pain meds -- PT when appropriate  Dementia without behavioral disturbance -- continue Aricept  Chronic respiratory failure secondary to pulmonary hypertension -- continue  oxygen maintain sats greater than 92% -- PRN bronchodilators -- incentive spirometer  Nutrition Status: Nutrition Problem: Severe Malnutrition Etiology: social / environmental circumstances (dementia, advanced) Signs/Symptoms: severe fat depletion,severe muscle depletion   follow dietitian recommendation    Procedures: left hip surgery Family communication : Consults :ortho dr Roland Rack CODE STATUS: full DVT Prophylaxis :Lovenox Level of care: Stepdown Status is: Inpatient  Remains inpatient appropriate because:Inpatient level of care appropriate due to severity of illness   Dispo: The patient is from: Home              Anticipated d/c is to: SNF              Patient currently is not medically stable to d/c.   Difficult to place patient No        TOTAL TIME TAKING CARE OF THIS PATIENT: 35 minutes.  >50% time spent on counselling and coordination of care  Note: This dictation was prepared with Dragon dictation along with smaller phrase technology. Any transcriptional errors that result from this process are unintentional.  Fritzi Mandes M.D    [facility]   CC:  Primary care physician; [facility], UncPatient ID: [name], female   DOB: [birth year], 87 y.o.   MRN: [id number]

## 2021-04-18 NOTE — Evaluation (Signed)
Physical Therapy Evaluation Patient Details Name: Cynthia Hart MRN: [id number] DOB: [birth year] Today's Date: 04/18/2021   History of Present Illness  87 y.o. female with medical history significant for dementia, hypertension, diabetes mellitus who was brought into the ER after a fall, L intertrocanteric fx with IM nailing ORIF 5/23.  Clinical Impression  Pt recently off pressers and despite low BP, frequent desaturations and low HBG levels cleared by nurse to try an do some PT.  Pt was only able to tolerate limited activity (due to pain, fatigue and confusions) but did show good effort with what she was able to do despite these.  Pt groaning in pain with essentially all L LE movement both PROM as well as AAROM (very little AROM).  She needed assist, extra time/cuing/encouragement as well as rest breaks to tolerate simple supine exercises. Due to many factors, and ultimately due to safety concerns we deferred attempts at mobility/standing.  Will maintain on caseload and per improved medical status hope to increase activity per pt tolerance.      Follow Up Recommendations SNF;Supervision/Assistance - 24 hour    Equipment Recommendations  None recommended by PT    Recommendations for Other Services       Precautions / Restrictions Precautions Precautions: Fall Restrictions Weight Bearing Restrictions: Yes LLE Weight Bearing: Weight bearing as tolerated      Mobility  Bed Mobility               General bed mobility comments: deferred secondary to c/o pain with all L hip movement, low BP t/o session, consistent O2 saturtions drops (to mid/low 80s on 5L) during supine exercises and most recent hemoglobin at 7.1 with low energy and awareness t/o our session.    Transfers                    Ambulation/Gait                Stairs            Wheelchair Mobility    Modified Rankin (Stroke Patients Only)       Balance                                              Pertinent Vitals/Pain Pain Assessment: Faces Faces Pain Scale: Hurts whole lot    Home Living Family/patient expects to be discharged to:: Skilled nursing facility                      Prior Function                 Hand Dominance        Extremity/Trunk Assessment   Upper Extremity Assessment Upper Extremity Assessment: Generalized weakness;Difficult to assess due to impaired cognition    Lower Extremity Assessment Lower Extremity Assessment: Generalized weakness;Difficult to assess due to impaired cognition (c/o severe pain with any and all L LE movement)       Communication      Cognition Arousal/Alertness: Awake/alert;Lethargic Behavior During Therapy: Flat affect Overall Cognitive Status: History of cognitive impairments - at baseline                                        General Comments  Exercises General Exercises - Lower Extremity Ankle Circles/Pumps: AROM;10 reps Quad Sets: Strengthening;AROM;10 reps Short Arc Quad: AAROM;10 reps (2 sets of 5 with rest breaks) Heel Slides: AAROM;5 reps (very poor tolerance, limited range) Hip ABduction/ADduction: 10 reps;AAROM (calling out in pain with only moderate range AAROM)   Assessment/Plan    PT Assessment Patient needs continued PT services  PT Problem List Decreased strength;Decreased range of motion;Decreased activity tolerance;Decreased balance;Decreased mobility;Decreased coordination;Decreased cognition;Decreased knowledge of use of DME;Decreased safety awareness;Cardiopulmonary status limiting activity;Pain       PT Treatment Interventions Gait training;Functional mobility training;Therapeutic activities;Therapeutic exercise;Balance training;Neuromuscular re-education;Cognitive remediation;Patient/family education;DME instruction    PT Goals (Current goals can be found in the Care Plan section)  Acute Rehab PT Goals Patient Stated Goal:  control L hip pain PT Goal Formulation: With patient Time For Goal Achievement: 05/02/21 Potential to Achieve Goals: Fair    Frequency BID (TBD per progress, may be more QD appropriate?)   Barriers to discharge        Co-evaluation               AM-PAC PT "6 Clicks" Mobility  Outcome Measure Help needed turning from your back to your side while in a flat bed without using bedrails?: Total Help needed moving from lying on your back to sitting on the side of a flat bed without using bedrails?: Total Help needed moving to and from a bed to a chair (including a wheelchair)?: Total Help needed standing up from a chair using your arms (e.g., wheelchair or bedside chair)?: Total Help needed to walk in hospital room?: Total Help needed climbing 3-5 steps with a railing? : Total 6 Click Score: 6    End of Session         PT Visit Diagnosis: Muscle weakness (generalized) (M62.81);Difficulty in walking, not elsewhere classified (R26.2);Pain;Unsteadiness on feet (R26.81) Pain - Right/Left: Left Pain - part of body: Hip    Time: 4431-5400 PT Time Calculation (min) (ACUTE ONLY): 27 min   Charges:   PT Evaluation $PT Eval Low Complexity: 1 Low PT Treatments $Therapeutic Exercise: 8-22 mins        Kreg Shropshire, DPT 04/18/2021, 11:01 AM

## 2021-04-18 NOTE — Progress Notes (Signed)
PT Cancellation Note  Patient Details Name: Cynthia Hart MRN: [id number] DOB: 17-Dec-1926   Cancelled Treatment:    Reason Eval/Treat Not Completed: Medical issues which prohibited therapy Pt has needed increased O2 since this AM - now on 12 L and going to be getting a blood transfusion this afternoon. Spoke with nurse who confirms we should hold PM session, will attempt tomorrow as appropriate.   Kreg Shropshire, DPT 04/18/2021, 4:04 PM

## 2021-04-18 NOTE — Progress Notes (Signed)
  Subjective: 1 Day Post-Op Procedure(s) (LRB): INTRAMEDULLARY (IM) NAIL INTERTROCHANTRIC (Left) Patient reports pain as mild.  She is reporting moderate pain in the left heel. Patient is improving, still requiring phenylephrine for BP. Currently on supplemental O2. Plan is to go Skilled nursing facility after hospital stay. Negative for chest pain and shortness of breath Fever: no Gastrointestinal:Negative for nausea and vomiting  Objective: Vital signs in last 24 hours: Temp:  [97.9 F (36.6 C)-99.4 F (37.4 C)] 98.6 F (37 C) (05/24 1200) Pulse Rate:  [45-106] 85 (05/24 1430) Resp:  [16-31] 28 (05/24 1430) BP: (64-133)/(40-95) 95/52 (05/24 1430) SpO2:  [80 %-100 %] 100 % (05/24 1430) Weight:  [55 kg] 55 kg (05/23 2025)  Intake/Output from previous day:  Intake/Output Summary (Last 24 hours) at 04/18/2021 1537 Last data filed at 04/18/2021 1402 Gross per 24 hour  Intake 2356.72 ml  Output 1151 ml  Net 1205.72 ml    Intake/Output this shift: Total I/O In: 1756.7 [P.O.:480; I.V.:1175.6; IV Piggyback:101.1] Out: 250 [Urine:250]  Labs: Recent Labs    04/11/2021 1054 04/18/21 0338  HGB 11.5* 7.1*   Recent Labs    04/16/2021 1054 04/18/21 0338  WBC 13.1* 12.6*  RBC 3.38* 2.16*  HCT 34.4* 22.6*  PLT 169 134*   Recent Labs    04/24/2021 1054 04/18/21 0338  NA 136 138  K 3.8 3.6  CL 100 106  CO2 29 24  BUN 15 18  CREATININE 0.50 0.47  GLUCOSE 139* 135*  CALCIUM 8.3* 6.9*   Recent Labs    04/14/2021 1054  INR 1.0     EXAM General - Patient is Lacking.  Patient does have some mild confusion but does answer simple questions about her left leg. Extremity - ABD soft Sensation intact distally Intact pulses distally Dorsiflexion/Plantar flexion intact Incision: scant drainage No cellulitis present  Tenderness to palpation to the left heel.  Elevated foot on towel. Dressing/Incision - blood tinged drainage Motor Function - intact, moving foot and toes well on  exam.  Abdomen with intact bowel sounds.  Soft to palpation.  Past Medical History:  Diagnosis Date  . Diabetes ([place])   . Hypertension   . Osteoarthritis   . Osteoporosis   . Postmenopausal   . Tumor   . Vitamin D deficiency     Assessment/Plan: 1 Day Post-Op Procedure(s) (LRB): INTRAMEDULLARY (IM) NAIL INTERTROCHANTRIC (Left) Principal Problem:   Femur fracture, left (HCC) Active Problems:   Hypertension   Dementia without behavioral disturbance (HCC)   Chronic respiratory failure (HCC)   Protein-calorie malnutrition, severe  Estimated body mass index is 18.44 kg/m as calculated from the following:   Height as of this encounter: 5\' 8"  (1.727 m).   Weight as of this encounter: 55 kg. Advance diet Up with therapy   Patient is still requiring vasopressor for BP. Labs reviewed, BP 95/52.  Hg 7.1.  Will order transfusion at this time. Up with therapy when able. Plan will be for d/c to SNF when appropriate.  DVT Prophylaxis - Lovenox and TED hose Weight-Bearing as tolerated to left leg  J. Cameron Proud, PA-C [facility] Orthopaedic Surgery 04/18/2021, 3:37 PM

## 2021-04-19 ENCOUNTER — Inpatient Hospital Stay: Payer: Medicare PPO

## 2021-04-19 DIAGNOSIS — S72102A Unspecified trochanteric fracture of left femur, initial encounter for closed fracture: Secondary | ICD-10-CM | POA: Diagnosis not present

## 2021-04-19 DIAGNOSIS — J9610 Chronic respiratory failure, unspecified whether with hypoxia or hypercapnia: Secondary | ICD-10-CM | POA: Diagnosis not present

## 2021-04-19 DIAGNOSIS — G309 Alzheimer's disease, unspecified: Secondary | ICD-10-CM | POA: Diagnosis not present

## 2021-04-19 DIAGNOSIS — E43 Unspecified severe protein-calorie malnutrition: Secondary | ICD-10-CM

## 2021-04-19 DIAGNOSIS — F0280 Dementia in other diseases classified elsewhere without behavioral disturbance: Secondary | ICD-10-CM

## 2021-04-19 LAB — BASIC METABOLIC PANEL
Anion gap: 5 (ref 5–15)
BUN: 21 mg/dL (ref 8–23)
CO2: 28 mmol/L (ref 22–32)
Calcium: 7.2 mg/dL — ABNORMAL LOW (ref 8.9–10.3)
Chloride: 103 mmol/L (ref 98–111)
Creatinine, Ser: 0.46 mg/dL (ref 0.44–1.00)
GFR, Estimated: >60 mL/min (ref >60)
Glucose, Bld: 122 mg/dL — ABNORMAL HIGH (ref 70–99)
Potassium: 4.1 mmol/L (ref 3.5–5.1)
Sodium: 136 mmol/L (ref 135–145)

## 2021-04-19 LAB — CBC
HCT: 24.8 % — ABNORMAL LOW (ref 36.0–46.0)
Hemoglobin: 8.3 g/dL — ABNORMAL LOW (ref 12.0–15.0)
MCH: 32.3 pg (ref 26.0–34.0)
MCHC: 33.5 g/dL (ref 30.0–36.0)
MCV: 96.5 fL (ref 80.0–100.0)
Platelets: 102 10*3/uL — ABNORMAL LOW (ref 150–400)
RBC: 2.57 MIL/uL — ABNORMAL LOW (ref 3.87–5.11)
RDW: 18.3 % — ABNORMAL HIGH (ref 11.5–15.5)
WBC: 9.6 10*3/uL (ref 4.0–10.5)
nRBC: 0 % (ref 0.0–0.2)

## 2021-04-19 LAB — PREPARE RBC (CROSSMATCH)

## 2021-04-19 MED ORDER — SODIUM CHLORIDE 0.9% IV SOLUTION: Freq: Once | INTRAVENOUS | Status: AC

## 2021-04-19 MED ORDER — TECHNETIUM TO 99M ALBUMIN AGGREGATED
4.0000 | Freq: Once | INTRAVENOUS | Status: AC | PRN
  Administered 2021-04-19: 4.02 via INTRAVENOUS

## 2021-04-19 MED ORDER — MAGNESIUM HYDROXIDE 400 MG/5ML PO SUSP
30.0000 mL | Freq: Once | ORAL | Status: AC
  Administered 2021-04-19: 30 mL via ORAL
  Filled 2021-04-19: qty 30

## 2021-04-19 NOTE — Progress Notes (Addendum)
[place] [facility] [place] NAME: Cynthia Hart    MR#:  [id number]  DATE OF BIRTH:  04-Aug-1927  SUBJECTIVE:  Patient complains of right lower quadrant pain today. No BM in several days. No vomiting. Received 2 unit prbc overnight. Still does not feel well.  REVIEW OF SYSTEMS:   Review of Systems  Unable to perform ROS: Dementia   Tolerating Diet: Tolerating PT:   DRUG ALLERGIES:   Allergies  Allergen Reactions  . Alendronate Other (See Comments)    From Fosamax-Stomach Pain  . Codeine     Nausea   . Ibandronic Acid Other (See Comments)    From Boniva-Stomach Pain  . Penicillins     TOLERATED CEFAZOLIN 04/01/2021 Nausea,rash and vomiting     VITALS:  Blood pressure (!) 144/84, pulse 77, temperature 98.2 F (36.8 C), temperature source Oral, resp. rate 15, height 5\' 8"  (1.727 m), weight 55 kg, SpO2 91 %.  PHYSICAL EXAMINATION:   Physical Exam  General exam: Alert, awake, no distress, appears pale Respiratory system: Clear to auscultation. Respiratory effort normal. Cardiovascular system:RRR. No murmurs, rubs, gallops. Gastrointestinal system: Abdomen is nondistended, soft and tender in RLQ. No organomegaly or masses felt. Normal bowel sounds heard. Central nervous system: . No focal neurological deficits. Extremities: No C/C/E, +pedal pulses Skin: No rashes, lesions or ulcers Psychiatry: Judgement and insight appear normal. Mood & affect appropriate.    LABORATORY PANEL:  CBC Recent Labs  Lab 04/19/21 0915  WBC 9.6  HGB 8.3*  HCT 24.8*  PLT 102*    Chemistries  Recent Labs  Lab 04/15/21 1014 04/23/2021 1054 04/19/21 0915  NA 137   < > 136  K 4.1   < > 4.1  CL 100   < > 103  CO2 30   < > 28  GLUCOSE 96   < > 122*  BUN 15   < > 21  CREATININE 0.52   < > 0.46  CALCIUM 8.4*   < > 7.2*  AST 18  --   --   ALT 9  --   --   ALKPHOS 36*  --   --   BILITOT 0.7  --   --    < > = values in this interval not  displayed.   Cardiac Enzymes No results for input(s): TROPONINI in the last 168 hours. RADIOLOGY:  DG Chest Port 1 View  Result Date: 04/18/2021 CLINICAL DATA:  Hypoxia. EXAM: PORTABLE CHEST 1 VIEW COMPARISON:  Single-view of the chest 04/12/2021 and 05/22/2019. FINDINGS: Heart size is upper normal. Subsegmental atelectasis is seen in the right lung base. Lungs otherwise clear. No pneumothorax or pleural fluid. Aortic atherosclerosis. Severe convex right thoracic scoliosis noted. IMPRESSION: No acute disease. Aortic Atherosclerosis (ICD10-I70.0). Electronically Signed   By: Inge Rise M.D.   On: 04/18/2021 14:49   DG HIP OPERATIVE UNILAT WITH PELVIS LEFT  Result Date: 04/16/2021 CLINICAL DATA:  Elective surgery. EXAM: OPERATIVE LEFT HIP (WITH PELVIS IF PERFORMED) TECHNIQUE: Fluoroscopic spot image(s) were submitted for interpretation post-operatively. COMPARISON:  Radiograph earlier today. FINDINGS: Six fluoroscopic spot views of the left hip and femur obtained in the operating room. Intramedullary nail with trans trochanteric and distal locking screw fixation of intertrochanteric femur fracture. Total fluoroscopy time 30 seconds. Total dose not provided. IMPRESSION: Procedural fluoroscopy for ORIF intertrochanteric femur fracture. Electronically Signed   By: Keith Rake M.D.   On: 04/03/2021 19:21   ASSESSMENT AND PLAN:  Cynthia  Hart is a 87 y.o. female with medical history significant for dementia, hypertension, diabetes mellitus who was brought into the ER for evaluation of left hip pain after an unwitnessed fall.  Left leg is noted to be shortened and externally rotated. Patient is lying in bed and appears comfortable and is unable to give any history at this time due to her underlying dementia.  Postoperative hypotension suspect due to volume loss -- operative notes reports proximately 300 mL blood loss -- hemoglobin 11-- 7.1-- she received 2 unit prbc with follow up hgb  8.3 -- patient was on IV phenylephrine-- currently off. Blood pressure remains stable.  Displaced left intertrochanteric femur fracture status post unwitnessed mechanical fall -- postop day #2 reduction and internal fixation of left hip fracture -- PRN pain meds -- PT when appropriate  Dementia without behavioral disturbance -- continue Aricept  Acute on Chronic respiratory failure secondary to pulmonary hypertension -- still requiring from 10-15L oxygen -- baseline requirement of 1.5L at home -- continue oxygen maintain sats greater than 92% -- PRN bronchodilators -- incentive spirometer --chest xray unrevealing --check VQ scan  RLQ abd pain --possibly related to constipation --will provide laxative  Nutrition Status: Nutrition Problem: Severe Malnutrition Etiology: social / environmental circumstances (dementia, advanced) Signs/Symptoms: severe fat depletion,severe muscle depletion   follow dietitian recommendation    Procedures: left hip surgery Family communication : updated daughter over the phone Consults :ortho dr Roland Rack CODE STATUS: full DVT Prophylaxis :Lovenox Level of care: Stepdown Status is: Inpatient  Remains inpatient appropriate because:Inpatient level of care appropriate due to severity of illness   Dispo: The patient is from: Home              Anticipated d/c is to: SNF              Patient currently is not medically stable to d/c.   Difficult to place patient No        TOTAL TIME TAKING CARE OF THIS PATIENT: 35 minutes.  >50% time spent on counselling and coordination of care  Note: This dictation was prepared with Dragon dictation along with smaller phrase technology. Any transcriptional errors that result from this process are unintentional.  Kathie Dike M.D    [facility]   CC: Primary care physician; Healthcare, UncPatient ID: Cynthia Hart, female   DOB: 25-Feb-1927, 87 y.o.   MRN: [id number]

## 2021-04-19 NOTE — NC FL2 (Signed)
[place] LEVEL OF CARE SCREENING TOOL     IDENTIFICATION  Patient Name: Cynthia Hart Birthdate: 08/06/1927 Sex: female Admission Date (Current Location): 04/21/2021  [place] and [place] Number:  Engineering geologist and Address:  [facility], [address], [place], [place] [address]      Provider Number:    Attending Physician Name and Address:  Kathie Dike, MD  Relative Name and Phone Number:  [email or web address] (Daughter)   [phone] (Home Phone)    Current Level of Care: Hospital Recommended Level of Care: [place] Prior Approval Number:    Date Approved/Denied:   PASRR Number: Pending  Discharge Plan: SNF    Current Diagnoses: Patient Active Problem List   Diagnosis Date Noted  . Protein-calorie malnutrition, severe 04/18/2021  . Femur fracture, left ([place]) 04/08/2021  . Hypertension   . Dementia without behavioral disturbance ([place])   . Chronic respiratory failure ([place])   . Ileus ([place]) 05/31/2019  . Urinary retention 05/30/2019  . Hip fracture ([place]) 05/22/2019  . Pre-operative cardiovascular examination 09/05/2018  . 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 Moderate to severe mitral regurgitation 09/05/2018  . Pulmonary hypertension, unspecified ([place]) 09/05/2018  . Postoperative examination 08/14/2018  . 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 Heart murmur 08/11/2018  . Dysphagia 08/11/2018    Orientation RESPIRATION BLADDER Height & Weight     Self  Normal Incontinent Weight: 121 lb 4.1 oz (55 kg) Height:  5\' 8"  (172.7 cm)  BEHAVIORAL SYMPTOMS/MOOD NEUROLOGICAL BOWEL NUTRITION STATUS      Incontinent Diet  AMBULATORY STATUS COMMUNICATION OF NEEDS Skin   Limited Assist Verbally Normal                       Personal Care Assistance Level of Assistance  Bathing,Feeding,Dressing,Total care Bathing Assistance: Limited assistance Feeding assistance: Limited  assistance Dressing Assistance: Limited assistance Total Care Assistance: Limited assistance   Functional Limitations Info  Sight,Hearing,Speech Sight Info: Adequate Hearing Info: Adequate Speech Info: Adequate    SPECIAL CARE FACTORS FREQUENCY  PT (By licensed PT),OT (By licensed OT)     PT Frequency: 5X per week OT Frequency: 5X per week            Contractures Contractures Info: Not present    Additional Factors Info         Moderna COVID-19 Vaccine 03/20/2021 , 01/08/2020 , 12/11/2019              Current Medications (04/19/2021):  This is the current hospital active medication list Current Facility-Administered Medications  Medication Dose Route Frequency Provider Last Rate Last Admin  . 0.9 %  sodium chloride infusion  250 mL Intravenous Continuous Benita Gutter, RPH   Paused at 04/16/2021 2231  . acetaminophen (TYLENOL) tablet 325-650 mg  325-650 mg Oral Q6H PRN Poggi, Marshall Cork, MD      . bisacodyl (DULCOLAX) suppository 10 mg  10 mg Rectal Daily PRN Poggi, Marshall Cork, MD      . Chlorhexidine Gluconate Cloth 2 % PADS 6 each  6 each Topical Daily Agbata, Tochukwu, MD   6 each at 04/19/21 1446  . diphenhydrAMINE (BENADRYL) 12.5 MG/5ML elixir 12.5-25 mg  12.5-25 mg Oral Q4H PRN Poggi, Marshall Cork, MD      . docusate sodium (COLACE) capsule 100 mg  100 mg Oral BID Corky Mull, MD   100 mg at 04/19/21 0951  . donepezil (ARICEPT) tablet 10 mg  10 mg  Oral QHS Poggi, Marshall Cork, MD   10 mg at 04/18/21 2148  . enoxaparin (LOVENOX) injection 40 mg  40 mg Subcutaneous Q24H Poggi, Marshall Cork, MD   40 mg at 04/19/21 0951  . feeding supplement (ENSURE ENLIVE / ENSURE PLUS) liquid 237 mL  237 mL Oral BID BM Fritzi Mandes, MD   237 mL at 04/18/21 1340  . gabapentin (NEURONTIN) capsule 100 mg  100 mg Oral TID Corky Mull, MD   100 mg at 04/19/21 1446  . HYDROcodone-acetaminophen (NORCO/VICODIN) 5-325 MG per tablet 1-2 tablet  1-2 tablet Oral Q6H PRN Poggi, Marshall Cork, MD   1 tablet at 04/18/21 0743  .  magnesium hydroxide (MILK OF MAGNESIA) suspension 30 mL  30 mL Oral Daily PRN Poggi, Marshall Cork, MD      . MEDLINE mouth rinse  15 mL Mouth Rinse BID Agbata, Tochukwu, MD   15 mL at 04/19/21 0952  . methocarbamol (ROBAXIN) tablet 500 mg  500 mg Oral Q6H PRN Poggi, Marshall Cork, MD       Or  . methocarbamol (ROBAXIN) 500 mg in dextrose 5 % 50 mL IVPB  500 mg Intravenous Q6H PRN Poggi, Marshall Cork, MD      . metoCLOPramide (REGLAN) tablet 5-10 mg  5-10 mg Oral Q8H PRN Poggi, Marshall Cork, MD       Or  . metoCLOPramide (REGLAN) injection 5-10 mg  5-10 mg Intravenous Q8H PRN Poggi, Marshall Cork, MD      . morphine 2 MG/ML injection 0.5 mg  0.5 mg Intravenous Q2H PRN Poggi, Marshall Cork, MD   0.5 mg at 04/19/21 1106  . multivitamin with minerals tablet 1 tablet  1 tablet Oral Daily Fritzi Mandes, MD   1 tablet at 04/19/21 0951  . ondansetron (ZOFRAN) tablet 4 mg  4 mg Oral Q6H PRN Poggi, Marshall Cork, MD       Or  . ondansetron [facility]) injection 4 mg  4 mg Intravenous Q6H PRN Poggi, Marshall Cork, MD   4 mg at 04/18/21 1005  . phenylephrine (NEOSYNEPHRINE) 10-0.9 MG/250ML-% infusion  25-200 mcg/min Intravenous Titrated Benita Gutter, [facility]   Stopped at 04/18/21 1835  . senna (SENOKOT) tablet 8.6 mg  1 tablet Oral BID Corky Mull, MD   8.6 mg at 04/19/21 0951  . sodium phosphate (FLEET) 7-19 GM/118ML enema 1 enema  1 enema Rectal Once PRN Poggi, Marshall Cork, MD      . traMADol Veatrice Bourbon) tablet 50 mg  50 mg Oral Q6H PRN Poggi, Marshall Cork, MD      . Derrill Memo ON 04/23/2021] Vitamin D3 CAPS 1 capsule  1 capsule Oral Q14 Days Poggi, Marshall Cork, MD         Discharge Medications: Please see discharge summary for a list of discharge medications.  Relevant Imaging Results:  Relevant Lab Results:   Additional Information SS# [id number]  Adelene Amas, LCSWA

## 2021-04-19 NOTE — Progress Notes (Signed)
Physical Therapy Treatment Patient Details Name: Cynthia Hart MRN: [id number] DOB: Jan 09, 1927 Today's Date: 04/19/2021    History of Present Illness 87 y.o. female with medical history significant for dementia, hypertension, diabetes mellitus who was brought into the ER after a fall, L intertrocanteric fx with IM nailing ORIF 5/23.    PT Comments    Pt pleasant and willing to participate with PT but frankly could tolerate very little both due to pain and needing non re-breather t/o the session. Did not perform mobility per discussion with CCU nurse, but was able to perform P/A/AAROM exercises to pt tolerance in attempt to insure L hip would get some post-op rehab.  Pt has a lot of pain with any an all L hip and knee motion.  Will maintain on PT caseload and we will attempt to see as medically appropriate.   Follow Up Recommendations  SNF;Supervision/Assistance - 24 hour     Equipment Recommendations  None recommended by PT    Recommendations for Other Services       Precautions / Restrictions Precautions Precautions: Fall Restrictions LLE Weight Bearing: Weight bearing as tolerated    Mobility  Bed Mobility               General bed mobility comments: per discussion with nurse deferred 2/2 fagility of medical situation (non-rebreather, low blood levels, etc)    Transfers                    Ambulation/Gait                 Stairs             Wheelchair Mobility    Modified Rankin (Stroke Patients Only)       Balance                                            Cognition Arousal/Alertness: Lethargic Behavior During Therapy: Anxious Overall Cognitive Status: History of cognitive impairments - at baseline                                        Exercises General Exercises - Lower Extremity Ankle Circles/Pumps: Strengthening;15 reps;AROM Quad Sets: Strengthening;AROM;10 reps Short Arc Quad:  AROM;10 reps (pt was able to perform AROM better today, still pain limited) Heel Slides: AAROM;10 reps (pt calling out in pain with each rep, AROM for leg extensions) Hip ABduction/ADduction: 10 reps;AAROM (c/o pain with even a modicum of hip motion, limited tolerance)    General Comments        Pertinent Vitals/Pain Pain Assessment: Faces Faces Pain Scale: Hurts whole lot Pain Location: "the whole leg" calling out in pain with any and all L hip and knee movement    Home Living                      Prior Function            PT Goals (current goals can now be found in the care plan section) Progress towards PT goals: Not progressing toward goals - comment (medical comlications limiting therapy)    Frequency    7X/week      PT Plan Current plan remains appropriate    Co-evaluation  AM-PAC PT "6 Clicks" Mobility   Outcome Measure  Help needed turning from your back to your side while in a flat bed without using bedrails?: Total Help needed moving from lying on your back to sitting on the side of a flat bed without using bedrails?: Total Help needed moving to and from a bed to a chair (including a wheelchair)?: Total Help needed standing up from a chair using your arms (e.g., wheelchair or bedside chair)?: Total Help needed to walk in hospital room?: Total Help needed climbing 3-5 steps with a railing? : Total 6 Click Score: 6    End of Session Equipment Utilized During Treatment: Gait belt Activity Tolerance: Patient limited by pain;Treatment limited secondary to medical complications (Comment) Patient left: in bed;with call bell/phone within reach Nurse Communication: Mobility status PT Visit Diagnosis: Muscle weakness (generalized) (M62.81);Difficulty in walking, not elsewhere classified (R26.2);Pain;Unsteadiness on feet (R26.81) Pain - Right/Left: Left Pain - part of body: Hip     Time: 9480-1655 PT Time Calculation (min) (ACUTE  ONLY): 14 min  Charges:  $Therapeutic Exercise: 8-22 mins                     Kreg Shropshire, DPT 04/19/2021, 5:24 PM

## 2021-04-19 NOTE — Progress Notes (Signed)
  Subjective: 2 Days Post-Op Procedure(s) (LRB): INTRAMEDULLARY (IM) NAIL INTERTROCHANTRIC (Left) Patient reports pain as mild. Patient now requiring constant O2. Most recent BP 107/62. Plan is to go Skilled nursing facility after hospital stay. Negative for chest pain and shortness of breath Fever: no Gastrointestinal:Negative for nausea and vomiting  Objective: Vital signs in last 24 hours: Temp:  [98.3 F (36.8 C)-99.4 F (37.4 C)] 98.4 F (36.9 C) (05/25 0600) Pulse Rate:  [64-106] 78 (05/25 0600) Resp:  [15-31] 25 (05/25 0600) BP: (81-132)/(35-95) 107/62 (05/25 0600) SpO2:  [80 %-100 %] 98 % (05/25 0600)  Intake/Output from previous day:  Intake/Output Summary (Last 24 hours) at 04/19/2021 0805 Last data filed at 04/19/2021 0600 Gross per 24 hour  Intake 1654.75 ml  Output 525 ml  Net 1129.75 ml    Intake/Output this shift: No intake/output data recorded.  Labs: Recent Labs    04/20/2021 1054 04/18/21 0338 04/18/21 2110  HGB 11.5* 7.1* 7.3*   Recent Labs    03/30/2021 1054 04/18/21 0338 04/18/21 2110  WBC 13.1* 12.6*  --   RBC 3.38* 2.16*  --   HCT 34.4* 22.6* 22.1*  PLT 169 134*  --    Recent Labs    04/12/2021 1054 04/18/21 0338  NA 136 138  K 3.8 3.6  CL 100 106  CO2 29 24  BUN 15 18  CREATININE 0.50 0.47  GLUCOSE 139* 135*  CALCIUM 8.3* 6.9*   Recent Labs    04/09/2021 1054  INR 1.0     EXAM General - Patient is Lacking.  Patient does have some mild confusion but does answer simple questions about her left leg. Extremity - ABD soft Sensation intact distally Intact pulses distally Dorsiflexion/Plantar flexion intact Incision: scant drainage No cellulitis present  No erythema, mild ecchymosis to the more proximal incision. Dressing/Incision - blood tinged drainage Motor Function - intact, moving foot and toes well on exam.  Abdomen with intact bowel sounds.  Soft to palpation.  Past Medical History:  Diagnosis Date  . Diabetes ([place])    . Hypertension   . Osteoarthritis   . Osteoporosis   . Postmenopausal   . Tumor   . Vitamin D deficiency     Assessment/Plan: 2 Days Post-Op Procedure(s) (LRB): INTRAMEDULLARY (IM) NAIL INTERTROCHANTRIC (Left) Principal Problem:   Femur fracture, left (HCC) Active Problems:   Hypertension   Dementia without behavioral disturbance (HCC)   Chronic respiratory failure (HCC)   Protein-calorie malnutrition, severe  Estimated body mass index is 18.44 kg/m as calculated from the following:   Height as of this encounter: 5\' 8"  (1.727 m).   Weight as of this encounter: 55 kg. Advance diet Up with therapy   Limited progress due to other medical conditions at this time. Patient is still requiring vasopressor for BP.  Continue to monitor. Labs reviewed Hg 7.3 after one unit transfusion.  Hg still pending following second unit. Up with therapy when able. Begin working on BM.  DVT Prophylaxis - Lovenox and TED hose Weight-Bearing as tolerated to left leg  J. Cameron Proud, PA-C [facility] Orthopaedic Surgery 04/19/2021, 8:05 AM

## 2021-04-19 NOTE — Progress Notes (Signed)
PT Cancellation Note  Patient Details Name: Cynthia Hart MRN: [id number] DOB: 07/31/1927   Cancelled Treatment:    Reason Eval/Treat Not Completed: Medical issues which prohibited therapy Spoke with nursing who reports he recently had to put non-rebreather back on pt.  She continues to have low HGB despite transfusion, nursing reports that she is not appropriate to work with PT this AM.  Will check-in this afternoon to assess appropriateness of maybe at least some bed exercises to keep the hip moving.  Will transition to QD (as opposed to planned BID) as she is medically unable to do a whole lot with PT at this point.    Kreg Shropshire, DPT 04/19/2021, 11:01 AM

## 2021-04-19 NOTE — TOC Initial Note (Addendum)
Transition of Care [facility]) - Initial/Assessment Note    Patient Details  Name: Cynthia Hart MRN: [id number] Date of Birth: 1927/03/26  Transition of Care [facility]) CM/SW Contact:    Ova Freshwater Phone Number: [phone] 04/19/2021, 3:05 PM  Clinical Narrative:                  Patient presents from [place] after fall.  Patient's main contact is Jerilynn Birkenhead (Daughter) [phone].  CSW spoke with Ms. Bland Span and explained the role of TOC in patient care.  Ms. Olga Coaster stated the patient lives in independent living and has 16 hour per day care givers, including herself.  Ms. Bland Span stated she supports SNF placement, as recommended by PT but preferred [facility] for placement.  CSW explained to Ms. Langely we could not guarantee preferred placement. Ms. Ashley Royalty stated she would like for the patient  To go to "a placement where they will care for her well." CSW stated I understood her concerns but I could not specific guarantee placement.  CSW stated I would begin SNF search and update Ms. Langely once I had new information. Ms. Marylou Flesher verbalized understanding.  No [place].  PASRR pending.   Expected Discharge Plan: Skilled Nursing Facility Barriers to Discharge: Continued Medical Work up,SNF Pending bed offer   Patient Goals and CMS Choice Patient states their goals for this hospitalization and ongoing recovery are:: To return home CMS Medicare.gov Compare Post Acute Care list provided to:: Other (Comment Required) (langley,carolyn (Daughter)   [phone] [facility] Phone)) Choice offered to / list presented to : Adult Children Science writer (Daughter)   [phone] (Home Phone))  Expected Discharge Plan and Services Expected Discharge Plan: [place] In-house Referral: Clinical Social Work   Post Acute Care Choice: [place] Living arrangements for the past 2 months: [place] ([place])                                      Prior Living Arrangements/Services Living arrangements for the past 2 months: [place] ([facility]) Lives with:: Self (Has private care givers throughout the day.) Patient language and need for interpreter reviewed:: Yes Do you feel safe going back to the place where you live?: Yes      Need for Family Participation in Patient Care: Yes (Comment) Care giver support system in place?: Yes (comment)   Criminal Activity/Legal Involvement Pertinent to Current Situation/Hospitalization: No - Comment as needed  Activities of Daily Living Home Assistive Devices/Equipment: Walker (specify type),Wheelchair,Bedside commode/3-in-1,Oxygen,Grab bars around toilet ADL Screening (condition at time of admission) Patient's cognitive ability adequate to safely complete daily activities?: No Is the patient deaf or have difficulty hearing?: Yes Does the patient have difficulty seeing, even when wearing glasses/contacts?: Yes Does the patient have difficulty concentrating, remembering, or making decisions?: Yes Patient able to express need for assistance with ADLs?: Yes Does the patient have difficulty dressing or bathing?: Yes Independently performs ADLs?: Yes (appropriate for developmental age) Does the patient have difficulty walking or climbing stairs?: Yes Weakness of Legs: Both Weakness of Arms/Hands: None  Permission Sought/Granted Permission sought to share information with : Facility Sport and exercise psychologist    Share Information with NAME: langley,carolyn (Daughter)   [phone] (Home Phone)           Emotional Assessment Appearance:: Appears stated age Attitude/Demeanor/Rapport: Unable to Assess Affect (typically  observed): Unable to Assess Orientation: : Fluctuating Orientation (Suspected and/or reported Sundowners) Alcohol / Substance Use: Not Applicable Psych Involvement: No  (comment)  Admission diagnosis:  Femur fracture, left (HCC) [S72.92XA] Closed intertrochanteric fracture, left, initial encounter ([place]) [S72.142A] Dementia without behavioral disturbance, unspecified dementia type ([place]) [F03.90] Patient Active Problem List   Diagnosis Date Noted  . Protein-calorie malnutrition, severe 04/18/2021  . Femur fracture, left ([place]) 04/13/2021  . Hypertension   . Dementia without behavioral disturbance ([place])   . Chronic respiratory failure ([place])   . Ileus ([place]) 05/31/2019  . Urinary retention 05/30/2019  . Hip fracture ([place]) 05/22/2019  . Pre-operative cardiovascular examination 09/05/2018  . 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 Moderate to severe mitral regurgitation 09/05/2018  . Pulmonary hypertension, unspecified ([place]) 09/05/2018  . Postoperative examination 08/14/2018  . 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 Heart murmur 08/11/2018  . Dysphagia 08/11/2018   PCP:  Healthcare, [place]:   [id number] - [place], [place] [address] [place] [address] Phone: [phone] Fax: [phone]     Social Determinants of Health (SDOH) Interventions    Readmission Risk Interventions No flowsheet data found.

## 2021-04-20 ENCOUNTER — Inpatient Hospital Stay: Payer: Medicare PPO

## 2021-04-20 DIAGNOSIS — G309 Alzheimer's disease, unspecified: Secondary | ICD-10-CM | POA: Diagnosis not present

## 2021-04-20 DIAGNOSIS — J9610 Chronic respiratory failure, unspecified whether with hypoxia or hypercapnia: Secondary | ICD-10-CM | POA: Diagnosis not present

## 2021-04-20 DIAGNOSIS — S72102A Unspecified trochanteric fracture of left femur, initial encounter for closed fracture: Secondary | ICD-10-CM | POA: Diagnosis not present

## 2021-04-20 DIAGNOSIS — E43 Unspecified severe protein-calorie malnutrition: Secondary | ICD-10-CM | POA: Diagnosis not present

## 2021-04-20 LAB — TYPE AND SCREEN
ABO/RH(D): O POS
Antibody Screen: NEGATIVE
Unit division: 0
Unit division: 0

## 2021-04-20 LAB — BPAM RBC
Blood Product Expiration Date: 202206132359
Blood Product Expiration Date: 202206132359
ISSUE DATE / TIME: 202205241622
ISSUE DATE / TIME: 202205250144
Unit Type and Rh: 5100
Unit Type and Rh: 5100

## 2021-04-20 LAB — CBC
HCT: 25.6 % — ABNORMAL LOW (ref 36.0–46.0)
Hemoglobin: 8.2 g/dL — ABNORMAL LOW (ref 12.0–15.0)
MCH: 31.3 pg (ref 26.0–34.0)
MCHC: 32 g/dL (ref 30.0–36.0)
MCV: 97.7 fL (ref 80.0–100.0)
Platelets: 123 10*3/uL — ABNORMAL LOW (ref 150–400)
RBC: 2.62 MIL/uL — ABNORMAL LOW (ref 3.87–5.11)
RDW: 17.6 % — ABNORMAL HIGH (ref 11.5–15.5)
WBC: 9.9 10*3/uL (ref 4.0–10.5)
nRBC: 0 % (ref 0.0–0.2)

## 2021-04-20 LAB — BLOOD GAS, ARTERIAL
Acid-Base Excess: 7.3 mmol/L — ABNORMAL HIGH (ref 0.0–2.0)
Bicarbonate: 36.2 mmol/L — ABNORMAL HIGH (ref 20.0–28.0)
FIO2: 1
O2 Saturation: 99.2 %
Patient temperature: 37
pCO2 arterial: 77 mmHg (ref 32.0–48.0)
pH, Arterial: 7.28 — ABNORMAL LOW (ref 7.350–7.450)
pO2, Arterial: 155 mmHg — ABNORMAL HIGH (ref 83.0–108.0)

## 2021-04-20 LAB — BASIC METABOLIC PANEL
Anion gap: 4 — ABNORMAL LOW (ref 5–15)
BUN: 22 mg/dL (ref 8–23)
CO2: 31 mmol/L (ref 22–32)
Calcium: 7.6 mg/dL — ABNORMAL LOW (ref 8.9–10.3)
Chloride: 104 mmol/L (ref 98–111)
Creatinine, Ser: 0.37 mg/dL — ABNORMAL LOW (ref 0.44–1.00)
GFR, Estimated: >60 mL/min (ref >60)
Glucose, Bld: 146 mg/dL — ABNORMAL HIGH (ref 70–99)
Potassium: 4.9 mmol/L (ref 3.5–5.1)
Sodium: 139 mmol/L (ref 135–145)

## 2021-04-20 NOTE — Progress Notes (Signed)
Patient had a burst of SVT while sleeping. Asymptomatic. HR went up to 153 and returned to SR 94. Dr. Damita Dunnings made aware. Will continue to monitor.

## 2021-04-20 NOTE — Progress Notes (Signed)
PRN Morphine given pre transport for CT scan. Patient returned to room. No acute events during transport. Will continue to monitor.

## 2021-04-20 NOTE — Progress Notes (Signed)
  Subjective: 3 Days Post-Op Procedure(s) (LRB): INTRAMEDULLARY (IM) NAIL INTERTROCHANTRIC (Left) Patient reports pain as mild to moderate. Patient now requiring constant O2. Most recent BP 107/62. Plan is to go Skilled nursing facility after hospital stay. Negative for chest pain and shortness of breath Fever: no Gastrointestinal:Negative for nausea and vomiting  Objective: Vital signs in last 24 hours: Temp:  [98.2 F (36.8 C)-99.3 F (37.4 C)] 99.1 F (37.3 C) (05/26 0400) Pulse Rate:  [77-94] 81 (05/26 0400) Resp:  [15-25] 15 (05/26 0600) BP: (108-144)/(48-84) 130/69 (05/26 0600) SpO2:  [91 %-100 %] 100 % (05/26 0200)  Intake/Output from previous day:  Intake/Output Summary (Last 24 hours) at 04/20/2021 0708 Last data filed at 04/20/2021 0600 Gross per 24 hour  Intake 490 ml  Output 825 ml  Net -335 ml    Intake/Output this shift: No intake/output data recorded.  Labs: Recent Labs    04/08/2021 1054 04/18/21 0338 04/18/21 2110 04/19/21 0915 04/20/21 0450  HGB 11.5* 7.1* 7.3* 8.3* 8.2*   Recent Labs    04/19/21 0915 04/20/21 0450  WBC 9.6 9.9  RBC 2.57* 2.62*  HCT 24.8* 25.6*  PLT 102* 123*   Recent Labs    04/19/21 0915 04/20/21 0450  NA 136 139  K 4.1 4.9  CL 103 104  CO2 28 31  BUN 21 22  CREATININE 0.46 0.37*  GLUCOSE 122* 146*  CALCIUM 7.2* 7.6*   Recent Labs    03/29/2021 1054  INR 1.0     EXAM General - Patient is Lacking.  Patient does have some mild confusion but does answer simple questions about her left leg. Extremity - ABD soft Sensation intact distally Intact pulses distally Dorsiflexion/Plantar flexion intact Incision: scant drainage No cellulitis present  No erythema, mild ecchymosis to the more proximal incision. Dressing/Incision - blood tinged drainage Motor Function - intact, moving foot and toes well on exam.  Abdomen with intact bowel sounds.  Soft to palpation.  Past Medical History:  Diagnosis Date  .  Diabetes ([place])   . Hypertension   . Osteoarthritis   . Osteoporosis   . Postmenopausal   . Tumor   . Vitamin D deficiency     Assessment/Plan: 3 Days Post-Op Procedure(s) (LRB): INTRAMEDULLARY (IM) NAIL INTERTROCHANTRIC (Left) Principal Problem:   Femur fracture, left (HCC) Active Problems:   Hypertension   Dementia without behavioral disturbance (HCC)   Chronic respiratory failure (HCC)   Protein-calorie malnutrition, severe  Estimated body mass index is 18.44 kg/m as calculated from the following:   Height as of this encounter: 5\' 8"  (1.727 m).   Weight as of this encounter: 55 kg. Advance diet Up with therapy   Limited progress due to other medical conditions at this time. Patient is still requiring vasopressor for BP.  Continue to monitor. Labs reviewed Hg 8.2 after transfusion.   Up with therapy when able. Continue working on BM.  DVT Prophylaxis - Lovenox and TED hose Weight-Bearing as tolerated to left leg  Reche Dixon, PA-C [facility] 04/20/2021, 7:08 AM

## 2021-04-20 NOTE — Progress Notes (Signed)
Informed by staff of critical PCO2 of 77  Discussed BiPAP therapy with patient's daughter.  She is agreeable to continue BiPAP therapy overnight and monitor for response.  If no significant improvement in mental status by morning, she would not wish to continue further BiPAP therapy.  She also agrees with further work-up with CT chest to evaluate any underlying cause of patient's hypoxia.  If the patient's overall condition deteriorates overnight, she does not wish to escalate care any further and would rather focus on comfort measures.  If patient remains stable overnight, will reassess ABG in a.m. as well as CT results.  If no significant improvements, family will consider transitioning to hospice/comfort care.  [employer]

## 2021-04-20 NOTE — Progress Notes (Signed)
PT Cancellation Note  Patient Details Name: Cynthia Hart MRN: [id number] DOB: 16-May-1927   Cancelled Treatment:     PT attempt. Pt too lethargic to participate. Unable to stay awake. Pt's granddaughter in room and very supportive. Did educate family on positioning and exercises to perform however pt unable to tolerate/ perform at this time.     Willette Pa 04/20/2021, 5:03 PM

## 2021-04-20 NOTE — Progress Notes (Signed)
  Date and time results received: 04/20/21   Test: ABG  Critical Value: CO 2 77  Name of Provider Notified: Kathie Dike  Orders Received? Or Actions Taken?: Pt placed on Bipap.

## 2021-04-20 NOTE — Plan of Care (Signed)

## 2021-04-20 NOTE — Progress Notes (Signed)
[facility] [place] NAME: Cynthia Hart    MR#:  [id number]  DATE OF BIRTH:  1927-01-02  SUBJECTIVE:  Patient is somnolent today. She complains of some pain in abdomen. Staff reports that she had a very small BM today. Still requiring NRB mask  REVIEW OF SYSTEMS:   Review of Systems  Unable to perform ROS: Dementia   Tolerating Diet: Tolerating PT:   DRUG ALLERGIES:   Allergies  Allergen Reactions  . Alendronate Other (See Comments)    From Fosamax-Stomach Pain  . Codeine     Nausea   . Ibandronic Acid Other (See Comments)    From Boniva-Stomach Pain  . Penicillins     TOLERATED CEFAZOLIN 04/06/2021 Nausea,rash and vomiting     VITALS:  Blood pressure (!) 113/57, pulse 97, temperature 98.9 F (37.2 C), resp. rate (!) 30, height 5\' 8"  (1.727 m), weight 55 kg, SpO2 93 %.  PHYSICAL EXAMINATION:   Physical Exam  General exam: somnolent, responds to voice Respiratory system: diminished breath sounds bilaterally. Respiratory effort normal. Cardiovascular system:RRR. No murmurs, rubs, gallops. Gastrointestinal system: Abdomen is distended, soft and tender in RLQ. No organomegaly or masses felt. Normal bowel sounds heard. Central nervous system:  No focal neurological deficits. Extremities: No C/C/E, +pedal pulses Skin: No rashes, lesions or ulcers Psychiatry:somnolent    LABORATORY PANEL:  CBC Recent Labs  Lab 04/20/21 0450  WBC 9.9  HGB 8.2*  HCT 25.6*  PLT 123*    Chemistries  Recent Labs  Lab 04/15/21 1014 04/11/2021 1054 04/20/21 0450  NA 137   < > 139  K 4.1   < > 4.9  CL 100   < > 104  CO2 30   < > 31  GLUCOSE 96   < > 146*  BUN 15   < > 22  CREATININE 0.52   < > 0.37*  CALCIUM 8.4*   < > 7.6*  AST 18  --   --   ALT 9  --   --   ALKPHOS 36*  --   --   BILITOT 0.7  --   --    < > = values in this interval not displayed.   Cardiac Enzymes No results for input(s): TROPONINI in the last 168  hours. RADIOLOGY:  NM Pulmonary Perfusion  Result Date: 04/19/2021 CLINICAL DATA:  High clinical suspicion of pulmonary embolism, unwitnessed fall with LEFT hip pain, LEFT hip fracture with LEFT hip surgery on 04/04/2021. Poor historian due to dementia. Respiratory failure, hypoxemia, question pulmonary embolism EXAM: NUCLEAR MEDICINE PERFUSION LUNG SCAN TECHNIQUE: Perfusion images were obtained in multiple projections after intravenous injection of radiopharmaceutical. Ventilation scans intentionally deferred if perfusion scan and chest x-ray adequate for interpretation during COVID 19 epidemic. RADIOPHARMACEUTICALS:  4.02 mCi Tc-75m MAA IV COMPARISON:  Chest radiograph 04/18/2021 FINDINGS: Thoracic deformity due to pronounced dextroconvex thoracic scoliosis. Artifacts from patient's arms and minimal RIGHT basilar atelectasis. Mild patchy irregular tracer distribution in the periphery of both lungs, pattern favoring parenchymal lung disease. No triangular segmental or subsegmental perfusion defects are seen to suggest pulmonary embolism. IMPRESSION: No scintigraphic evidence of pulmonary embolism. Electronically Signed   By: Lavonia Dana M.D.   On: 04/19/2021 14:48   ASSESSMENT AND PLAN:  Cynthia Hart is a 87 y.o. female with medical history significant for dementia, hypertension, diabetes mellitus who was brought into the ER for evaluation of left hip pain after an unwitnessed fall.  Left leg  is noted to be shortened and externally rotated. Patient is lying in bed and appears comfortable and is unable to give any history at this time due to her underlying dementia.  Postoperative hypotension suspect due to volume loss -- operative notes reports proximately 300 mL blood loss -- hemoglobin 11-- 7.1-- she received 2 unit prbc with follow up hgb 8.3 -- patient was on IV phenylephrine-- currently off. Blood pressure remains stable.  Displaced left intertrochanteric femur fracture status post  unwitnessed mechanical fall -- postop day #3 reduction and internal fixation of left hip fracture -- PRN pain meds -- PT when appropriate  Dementia without behavioral disturbance -- continue Aricept  Acute on Chronic respiratory failure secondary to pulmonary hypertension -- still requiring from 15L oxygen on NRB -- baseline requirement of 1.5L at home -- continue oxygen maintain sats greater than 92% -- PRN bronchodilators -- incentive spirometer --chest xray unrevealing --VQ scan negative for PE --will check CT chest --since she is somnolent, will check ABG  RLQ abd pain --possibly related to constipation --no significant improvement with laxatives -check CT abdomen  Nutrition Status: Nutrition Problem: Severe Malnutrition Etiology: social / environmental circumstances (dementia, advanced) Signs/Symptoms: severe fat depletion,severe muscle depletion   follow dietitian recommendation    Procedures: left hip surgery Family communication : updated grand daughter at the bedside and daughter over the phone Consults :ortho dr Roland Rack CODE STATUS: DNR, confirmed with daughter DVT Prophylaxis :Lovenox Level of care: Stepdown Status is: Inpatient  Remains inpatient appropriate because:Inpatient level of care appropriate due to severity of illness   Dispo: The patient is from: Home              Anticipated d/c is to: SNF              Patient currently is not medically stable to d/c.   Difficult to place patient No        TOTAL TIME TAKING CARE OF THIS PATIENT: 35 minutes.  >50% time spent on counselling and coordination of care  Note: This dictation was prepared with Dragon dictation along with smaller phrase technology. Any transcriptional errors that result from this process are unintentional.  Kathie Dike M.D    [facility]   CC: Primary care physician; Healthcare, [facility]cPatient ID: Cynthia Hart, female   DOB: 11-Apr-1927, 87 y.o.   MRN:  [id number]

## 2021-04-21 DIAGNOSIS — J9610 Chronic respiratory failure, unspecified whether with hypoxia or hypercapnia: Secondary | ICD-10-CM | POA: Diagnosis not present

## 2021-04-21 DIAGNOSIS — E43 Unspecified severe protein-calorie malnutrition: Secondary | ICD-10-CM | POA: Diagnosis not present

## 2021-04-21 DIAGNOSIS — G309 Alzheimer's disease, unspecified: Secondary | ICD-10-CM | POA: Diagnosis not present

## 2021-04-21 DIAGNOSIS — S72102A Unspecified trochanteric fracture of left femur, initial encounter for closed fracture: Secondary | ICD-10-CM | POA: Diagnosis not present

## 2021-04-21 LAB — BLOOD GAS, ARTERIAL
Acid-Base Excess: 7.8 mmol/L — ABNORMAL HIGH (ref 0.0–2.0)
Bicarbonate: 36.9 mmol/L — ABNORMAL HIGH (ref 20.0–28.0)
FIO2: 100
O2 Saturation: 99.3 %
Patient temperature: 37
pCO2 arterial: 70 mmHg (ref 32.0–48.0)
pH, Arterial: 7.33 — ABNORMAL LOW (ref 7.350–7.450)
pO2, Arterial: 155 mmHg — ABNORMAL HIGH (ref 83.0–108.0)

## 2021-04-21 LAB — RENAL FUNCTION PANEL
Albumin: 3 g/dL — ABNORMAL LOW (ref 3.5–5.0)
Anion gap: 6 (ref 5–15)
BUN: 30 mg/dL — ABNORMAL HIGH (ref 8–23)
CO2: 33 mmol/L — ABNORMAL HIGH (ref 22–32)
Calcium: 8.6 mg/dL — ABNORMAL LOW (ref 8.9–10.3)
Chloride: 103 mmol/L (ref 98–111)
Creatinine, Ser: 0.5 mg/dL (ref 0.44–1.00)
GFR, Estimated: >60 mL/min (ref >60)
Glucose, Bld: 156 mg/dL — ABNORMAL HIGH (ref 70–99)
Phosphorus: 1.9 mg/dL — ABNORMAL LOW (ref 2.5–4.6)
Potassium: 5.1 mmol/L (ref 3.5–5.1)
Sodium: 142 mmol/L (ref 135–145)

## 2021-04-21 LAB — CBC
HCT: 25.8 % — ABNORMAL LOW (ref 36.0–46.0)
Hemoglobin: 8 g/dL — ABNORMAL LOW (ref 12.0–15.0)
MCH: 32.1 pg (ref 26.0–34.0)
MCHC: 31 g/dL (ref 30.0–36.0)
MCV: 103.6 fL — ABNORMAL HIGH (ref 80.0–100.0)
Platelets: 166 10*3/uL (ref 150–400)
RBC: 2.49 MIL/uL — ABNORMAL LOW (ref 3.87–5.11)
RDW: 16.7 % — ABNORMAL HIGH (ref 11.5–15.5)
WBC: 10.3 10*3/uL (ref 4.0–10.5)
nRBC: 0 % (ref 0.0–0.2)

## 2021-04-21 LAB — BRAIN NATRIURETIC PEPTIDE: B Natriuretic Peptide: 539.4 pg/mL — ABNORMAL HIGH (ref 0.0–100.0)

## 2021-04-21 MED ORDER — HYDROMORPHONE HCL 1 MG/ML IJ SOLN
0.5000 mg | Freq: Once | INTRAMUSCULAR | Status: AC
  Administered 2021-04-21: 0.5 mg via INTRAVENOUS
  Filled 2021-04-21: qty 1

## 2021-04-21 MED ORDER — SODIUM CHLORIDE 0.9 % IV SOLN
1.0000 g | INTRAVENOUS | Status: DC
  Administered 2021-04-21: 1 g via INTRAVENOUS
  Filled 2021-04-21: qty 1
  Filled 2021-04-21: qty 10

## 2021-04-21 MED ORDER — HYDROMORPHONE HCL 1 MG/ML IJ SOLN
0.5000 mg | INTRAMUSCULAR | Status: DC | PRN
  Administered 2021-04-21 – 2021-04-22 (×4): 0.5 mg via INTRAVENOUS
  Filled 2021-04-21 (×4): qty 1

## 2021-04-21 MED ORDER — SODIUM CHLORIDE 0.9 % IV SOLN
500.0000 mg | INTRAVENOUS | Status: DC
  Administered 2021-04-21: 500 mg via INTRAVENOUS
  Filled 2021-04-21 (×2): qty 500

## 2021-04-21 NOTE — Progress Notes (Signed)
[facility] [place] NAME: Cynthia Hart    MR#:  [id number]  DATE OF BIRTH:  10/27/1927  SUBJECTIVE:  Currently on NRB mask. She is somnolent, recently got pain medications. She was very uncomfortable earlier, hollering out in pain  REVIEW OF SYSTEMS:   Review of Systems  Unable to perform ROS: Dementia   Tolerating Diet: Tolerating PT:   DRUG ALLERGIES:   Allergies  Allergen Reactions  . Alendronate Other (See Comments)    From Fosamax-Stomach Pain  . Codeine     Nausea   . Ibandronic Acid Other (See Comments)    From Boniva-Stomach Pain  . Penicillins     TOLERATED CEFAZOLIN 04/11/2021 Nausea,rash and vomiting     VITALS:  Blood pressure 130/72, pulse (!) 101, temperature 99.5 F (37.5 C), temperature source Axillary, resp. rate 18, height 5\' 8"  (1.727 m), weight 55 kg, SpO2 95 %.  PHYSICAL EXAMINATION:   Physical Exam  General exam: somnolent Respiratory system: Clear to auscultation. Increased resp effort Cardiovascular system:irregular. No murmurs, rubs, gallops. Gastrointestinal system: Abdomen is distended, soft and diffusely tender. No organomegaly or masses felt. Hypoactive bowel sounds heard. Marland Kitchen     LABORATORY PANEL:  CBC Recent Labs  Lab 04/21/21 0523  WBC 10.3  HGB 8.0*  HCT 25.8*  PLT 166    Chemistries  Recent Labs  Lab 04/15/21 1014 04/06/2021 1054 04/21/21 0523  NA 137   < > 142  K 4.1   < > 5.1  CL 100   < > 103  CO2 30   < > 33*  GLUCOSE 96   < > 156*  BUN 15   < > 30*  CREATININE 0.52   < > 0.50  CALCIUM 8.4*   < > 8.6*  AST 18  --   --   ALT 9  --   --   ALKPHOS 36*  --   --   BILITOT 0.7  --   --    < > = values in this interval not displayed.   Cardiac Enzymes No results for input(s): TROPONINI in the last 168 hours. RADIOLOGY:  CT CHEST ABDOMEN PELVIS WO CONTRAST  Result Date: 04/20/2021 CLINICAL DATA:  Abdomen distension somnolent EXAM: CT CHEST, ABDOMEN AND PELVIS  WITHOUT CONTRAST TECHNIQUE: Multidetector CT imaging of the chest, abdomen and pelvis was performed following the standard protocol without IV contrast. COMPARISON:  Chest x-ray 04/23/2021, CT 05/30/2019, radiograph 03/31/2021 FINDINGS: CT CHEST FINDINGS Cardiovascular: Limited evaluation without intravenous contrast. Moderate aortic atherosclerosis. No aneurysmal dilatation. Mild coronary calcification. Borderline cardiac size. No pericardial effusion Mediastinum/Nodes: Midline trachea. 1.3 cm hypodense nodule left lobe of thyroid, no further workup based on life expectancy and comorbidity. No suspicious adenopathy. Multiple calcified hilar and mediastinal lymph nodes consistent with prior granulomatous disease. Lungs/Pleura: Small bilateral pleural effusions. Consolidations in the right greater than left lower lobes with air bronchograms. Mild ground-glass densities and patchy consolidations within the posterior upper lobes with mild central lobular density. Debris and or fluid within the right lower lobe bronchus. No pneumothorax Musculoskeletal: Sternum is intact. Kyphoscoliosis of the spine. Acute appearing fracture involves the superior endplate of X10 and involves the right anterior aspect of the vertebral body. CT ABDOMEN PELVIS FINDINGS Hepatobiliary: Calcified granuloma. Status post cholecystectomy. No biliary dilatation Pancreas: Unremarkable. No pancreatic ductal dilatation or surrounding inflammatory changes. Spleen: Numerous calcified granuloma Adrenals/Urinary Tract: Adrenal glands are normal. Kidneys show no hydronephrosis. Multiple left renal cysts.  Bladder decompressed by Foley catheter. Stomach/Bowel: Stomach decompressed. No convincing small bowel distension. No acute bowel wall thickening. Moderate diffuse gaseous dilatation of the colon with fluid and air distension of the rectosigmoid colon. Question small lipoma within right abdominal small bowel, series 2, image 73. Vascular/Lymphatic:  Moderate aortic atherosclerosis. No aneurysm. No suspicious nodes. Reproductive: Uterus and bilateral adnexa are unremarkable. Other: No free air.  Small amount fluid within the pelvis. Musculoskeletal: Intramedullary rodding of both femurs. Generalized soft tissue edema involving the left hip and thigh consistent with recent osseous trauma and surgery. IMPRESSION: 1. Small bilateral pleural effusions. Right greater than left lower lobe consolidations with partial consolidations and ground-glass density in the posterior upper lobes. Small amount of fluid and or debris in right lower lobe bronchus. Findings suspicious for pneumonia and or aspiration. 2. Diffuse air distension of the colon with moderate to marked air in fluid distension of rectosigmoid colon suggestive of ileus. No convincing evidence for an obstruction. There is no acute bowel wall thickening. 3. Acute appearing superior endplate fracture at N82 with extension of fracture lucency to the right anterior aspect of the vertebral body. Recent fracture of the left femur with postsurgical change and associated soft tissue edema Electronically Signed   By: Donavan Foil M.D.   On: 04/20/2021 21:20   ASSESSMENT AND PLAN:  Cynthia Hart is a 87 y.o. female with medical history significant for dementia, hypertension, diabetes mellitus who was brought into the ER for evaluation of left hip pain after an unwitnessed fall.  Left leg is noted to be shortened and externally rotated. Patient is lying in bed and appears comfortable and is unable to give any history at this time due to her underlying dementia.  Postoperative hypotension suspect due to volume loss -- operative notes reports proximately 300 mL blood loss -- hemoglobin 11-- 7.1-- she received 2 unit prbc with follow up hgb 8.3 -- patient was on IV phenylephrine-- currently off. Blood pressure remains stable.  Displaced left intertrochanteric femur fracture status post unwitnessed mechanical  fall -- postop day #4 reduction and internal fixation of left hip fracture -- PRN pain meds   Dementia without behavioral disturbance --chronically on Aricept  Acute on Chronic respiratory failure with hypoxia and hypercapnia -- still requiring from 15L oxygen on NRB -- baseline requirement of 1.5L at home -- continue oxygen maintain sats greater than 92% -- PRN bronchodilators -- incentive spirometer --chest xray unrevealing --VQ scan negative for PE --ABG shows respiratory acidosis --Bipap was attempted on 5/26 with only minimal improvement in PCO2 -patient unable to tolerate further bipap --CT chest shows possible aspiration/pneumonia with small pleural effusions  RLQ abd pain --possibly related to constipation --no significant improvement with laxatives --CT abdomen shows ileus without signs of obstruction  Goals of care -Long discussion with patient's daughter [name] who is her POA --[name] admits that patient had been having a gradual decline even prior to fall -she was able to ambulate with a walker, but had chronic pain from her prior hip repair and from scoliosis --Patient's current state was reviewed and with her overall frail state, multi organ system dysfunction, her long term prognosis is poor --[name] feels confident that her mother would choose dignity and comfort at this stage --She is agreeable to transition care towards quality of life and comfort -will continue pain medications to maintain comfort, she seems to be responding better to dilaudid than morphine --will simplify her medication regimen to include medications directly impacting her comfort --[name] says  that several family members will be trying to come in from out of state to see patient --I did explain that if patient's condition further deteriorates in the interim, we would not plan to escalate care. Our focus would still be her comfort. [name] is understanding of this and is in agreement -We  can engage residential hospice if patient stabilizes, although I suspect she may have an in-hospital death   Nutrition Status: Nutrition Problem: Severe Malnutrition Etiology: social / environmental circumstances (dementia, advanced) Signs/Symptoms: severe fat depletion,severe muscle depletion   follow dietitian recommendation    Procedures: left hip surgery Family communication : updated daughter at the bedside  Consults :ortho dr Roland Rack CODE STATUS: DNR, comfort measures DVT Prophylaxis :none Level of care: Med-Surg Status is: Inpatient  Remains inpatient appropriate because:Inpatient level of care appropriate due to severity of illness   Dispo: The patient is from: Home              Anticipated d/c is to: SNF              Patient currently is not medically stable to d/c.   Difficult to place patient No        TOTAL TIME TAKING CARE OF THIS PATIENT: 35 minutes.  >50% time spent on counselling and coordination of care  Note: This dictation was prepared with Dragon dictation along with smaller phrase technology. Any transcriptional errors that result from this process are unintentional.  Kathie Dike M.D    [facility]   CC: Primary care physician; Healthcare, UncPatient ID: [name], female   DOB: 20-Dec-1926, 87 y.o.   MRN: [id number]

## 2021-04-21 NOTE — Progress Notes (Signed)
Received report via phone from ICU RN, [place].

## 2021-04-21 NOTE — Progress Notes (Signed)
Patient off bipap. PCO2 per abg noted at 70. Discussed finding with RN as patient has requested to come off bipap due to facial pain. Per RN patient is responding appropriately. Patient whimpers with stimulation but will calm when you speak with her. NRB in use. bipap on sb

## 2021-04-21 NOTE — Progress Notes (Signed)
PT Cancellation Note  Patient Details Name: Cynthia Hart MRN: [id number] DOB: March 01, 1927   Cancelled Treatment:     Pt is not appropriate to participate in PT at this time. Will continue efforts as able per POC.    Willette Pa 04/21/2021, 1:17 PM

## 2021-04-21 NOTE — Progress Notes (Signed)
Patient complaining of nose pain secondary to bipap mask. Placed patient on NRB for comfort and applied foam padding over bridge of nose. Requested to have covering physician call family and discuss how we proceed with care.

## 2021-04-21 NOTE — Progress Notes (Signed)
Patient O2 sats in the upper 80s. FiO2 increased to 70%. O2 sats in low 90s. RT aware. Will continue to monitor.

## 2021-04-21 NOTE — TOC Progression Note (Addendum)
Transition of Care [facility]) - Progression Note    Patient Details  Name: Cynthia Hart MRN: [id number] Date of Birth: [birth year]  Transition of Care [facility]) CM/SW [place], [place] Phone Number: 04/21/2021, 9:45 AM  Clinical Narrative:     Patient has received SNF bed offers.  CSW called patient's daughter Cynthia Hart [phone] to update. Cynthia Hart phone was busy both attempts.  CSW will try again later today.  Expected Discharge Plan: [place] Barriers to Discharge: Continued Medical Work up,SNF Pending bed offer  Expected Discharge Plan and Services Expected Discharge Plan: [place] In-house Referral: Clinical Social Work   Post Acute Care Choice: [place] Living arrangements for the past 2 months: [place] ([place])                                       Social Determinants of Health (SDOH) Interventions    Readmission Risk Interventions No flowsheet data found.

## 2021-04-21 NOTE — Progress Notes (Signed)
Cross coverage  Increasing discomfort on BiPAP and daughter was in agreement with removing BiPAP.  Patient currently on nonrebreather.   Right-sided abdominal pain unrelieved with current morphine dose of 0.5 mg every 2.  Trial of one-time dose of Dilaudid 0.5 ordered. CT chest reviewed with findings of aspiration pneumonia and ileus.  Rocephin and azithromycin started.  We will keep off BiPAP due to risk of aspiration and as patient's respiratory status has slightly improved

## 2021-04-21 NOTE — Progress Notes (Signed)
Spoke with patient daughter about patient condition, not wanting to wear the bipap due to face pain. Daughter agrees with trying to keep patient comfortable as possible at this time. Will continue to monitor.

## 2021-04-21 NOTE — Progress Notes (Signed)
Post Dilaudid administration for pain mgmt, patient removed NRB mask. Received call from central tele that sats were in the 60s. Patient was placed back on NRB but sats did not improve. Patient placed back on bipap, Dr. Damita Dunnings made aware. Will continue to monitor.

## 2021-04-22 DIAGNOSIS — J9610 Chronic respiratory failure, unspecified whether with hypoxia or hypercapnia: Secondary | ICD-10-CM | POA: Diagnosis not present

## 2021-04-22 DIAGNOSIS — J9621 Acute and chronic respiratory failure with hypoxia: Secondary | ICD-10-CM | POA: Diagnosis present

## 2021-04-22 DIAGNOSIS — S72102A Unspecified trochanteric fracture of left femur, initial encounter for closed fracture: Secondary | ICD-10-CM | POA: Diagnosis not present

## 2021-04-22 DIAGNOSIS — G309 Alzheimer's disease, unspecified: Secondary | ICD-10-CM | POA: Diagnosis not present

## 2021-04-22 DIAGNOSIS — E43 Unspecified severe protein-calorie malnutrition: Secondary | ICD-10-CM | POA: Diagnosis not present

## 2021-04-22 DIAGNOSIS — D696 Thrombocytopenia, unspecified: Secondary | ICD-10-CM | POA: Diagnosis not present

## 2021-04-22 DIAGNOSIS — J690 Pneumonitis due to inhalation of food and vomit: Secondary | ICD-10-CM | POA: Diagnosis present

## 2021-04-22 DIAGNOSIS — G9341 Metabolic encephalopathy: Secondary | ICD-10-CM | POA: Diagnosis not present

## 2021-04-22 DIAGNOSIS — Z66 Do not resuscitate: Secondary | ICD-10-CM | POA: Diagnosis present

## 2021-04-22 DIAGNOSIS — D62 Acute posthemorrhagic anemia: Secondary | ICD-10-CM | POA: Diagnosis not present

## 2021-04-22 MED ORDER — HALOPERIDOL 0.5 MG PO TABS
0.5000 mg | ORAL_TABLET | ORAL | Status: DC | PRN
  Filled 2021-04-22: qty 1

## 2021-04-22 MED ORDER — ONDANSETRON 4 MG PO TBDP
4.0000 mg | ORAL_TABLET | Freq: Four times a day (QID) | ORAL | Status: DC | PRN
  Filled 2021-04-22: qty 1

## 2021-04-22 MED ORDER — GLYCOPYRROLATE 0.2 MG/ML IJ SOLN
0.2000 mg | INTRAMUSCULAR | Status: DC | PRN
  Filled 2021-04-22: qty 1

## 2021-04-22 MED ORDER — HALOPERIDOL LACTATE 2 MG/ML PO CONC
0.5000 mg | ORAL | Status: DC | PRN
Start: 2021-04-22 — End: 2021-04-23
  Filled 2021-04-22: qty 0.3

## 2021-04-22 MED ORDER — HALOPERIDOL LACTATE 5 MG/ML IJ SOLN: 0.5000 mg | INTRAMUSCULAR | Status: DC | PRN

## 2021-04-22 MED ORDER — HYDROMORPHONE HCL 1 MG/ML IJ SOLN: 0.5000 mg | INTRAMUSCULAR | Status: DC | PRN

## 2021-04-22 MED ORDER — GLYCOPYRROLATE 0.2 MG/ML IJ SOLN
0.2000 mg | INTRAMUSCULAR | Status: DC | PRN
  Administered 2021-04-22: 0.2 mg via INTRAVENOUS
  Filled 2021-04-22 (×3): qty 1

## 2021-04-22 MED ORDER — LORAZEPAM 2 MG/ML IJ SOLN: 1.0000 mg | INTRAMUSCULAR | Status: DC | PRN

## 2021-04-22 MED ORDER — BIOTENE DRY MOUTH MT LIQD: 15.0000 mL | OROMUCOSAL | Status: DC | PRN

## 2021-04-22 MED ORDER — HYDROMORPHONE BOLUS VIA INFUSION
0.5000 mg | INTRAVENOUS | Status: DC | PRN
Start: 2021-04-22 — End: 2021-04-23
  Filled 2021-04-22: qty 1

## 2021-04-22 MED ORDER — LORAZEPAM 2 MG/ML PO CONC: 1.0000 mg | ORAL | Status: DC | PRN

## 2021-04-22 MED ORDER — HYDROMORPHONE HCL 1 MG/ML IJ SOLN
1.0000 mg | INTRAMUSCULAR | Status: DC | PRN
  Administered 2021-04-22: 1 mg via INTRAVENOUS
  Filled 2021-04-22: qty 1

## 2021-04-22 MED ORDER — ONDANSETRON HCL 4 MG/2ML IJ SOLN: 4.0000 mg | Freq: Four times a day (QID) | INTRAMUSCULAR | Status: DC | PRN

## 2021-04-22 MED ORDER — POLYVINYL ALCOHOL 1.4 % OP SOLN
1.0000 [drp] | Freq: Four times a day (QID) | OPHTHALMIC | Status: DC | PRN
  Filled 2021-04-22: qty 15

## 2021-04-22 MED ORDER — GLYCOPYRROLATE 1 MG PO TABS
1.0000 mg | ORAL_TABLET | ORAL | Status: DC | PRN
  Filled 2021-04-22: qty 1

## 2021-04-22 MED ORDER — LORAZEPAM 1 MG PO TABS: 1.0000 mg | ORAL_TABLET | ORAL | Status: DC | PRN

## 2021-04-22 MED ORDER — SODIUM CHLORIDE 0.9 % IV SOLN
1.0000 mg/h | INTRAVENOUS | Status: DC
  Administered 2021-04-22: 1 mg/h via INTRAVENOUS
  Filled 2021-04-22: qty 5

## 2021-04-26 NOTE — Progress Notes (Signed)
Donor Services called referral number [phone]  States she was a full release and not suitable due to her age for any donation

## 2021-04-26 NOTE — Progress Notes (Signed)
Cynthia Hart NAME: Cynthia Hart    MR#:  [id number]  DATE OF BIRTH:  1927-10-10  SUBJECTIVE:  Remains on NRB mask. Breathing appears labored. She did receive a dose of dilaudid a few hours ago  REVIEW OF SYSTEMS:   Review of Systems  Unable to perform ROS: Dementia   Tolerating Diet: Tolerating PT:   DRUG ALLERGIES:   Allergies  Allergen Reactions  . Alendronate Other (See Comments)    From Fosamax-Stomach Pain  . Codeine     Nausea   . Ibandronic Acid Other (See Comments)    From Boniva-Stomach Pain  . Penicillins     TOLERATED CEFAZOLIN 04/21/2021 Nausea,rash and vomiting     VITALS:  Blood pressure 111/73, pulse 90, temperature 98.5 F (36.9 C), temperature source Oral, resp. rate (!) 43, height 5\' 8"  (1.727 m), weight 55 kg, SpO2 97 %.  PHYSICAL EXAMINATION:   Physical Exam  General exam: unresponsive Respiratory system: bilateral rhonchi, increased resp effort Cardiovascular system:RRR. No murmurs, rubs, gallops.   Marland Kitchen     LABORATORY PANEL:  CBC Recent Labs  Lab 04/21/21 0523  WBC 10.3  HGB 8.0*  HCT 25.8*  PLT 166    Chemistries  Recent Labs  Lab 04/21/21 0523  NA 142  K 5.1  CL 103  CO2 33*  GLUCOSE 156*  BUN 30*  CREATININE 0.50  CALCIUM 8.6*   Cardiac Enzymes No results for input(s): TROPONINI in the last 168 hours. RADIOLOGY:  CT CHEST ABDOMEN PELVIS WO CONTRAST  Result Date: 04/20/2021 CLINICAL DATA:  Abdomen distension somnolent EXAM: CT CHEST, ABDOMEN AND PELVIS WITHOUT CONTRAST TECHNIQUE: Multidetector CT imaging of the chest, abdomen and pelvis was performed following the standard protocol without IV contrast. COMPARISON:  Chest x-ray 04/23/2021, CT 05/30/2019, radiograph 04/15/2021 FINDINGS: CT CHEST FINDINGS Cardiovascular: Limited evaluation without intravenous contrast. Moderate aortic atherosclerosis. No aneurysmal dilatation. Mild coronary calcification. Borderline  cardiac size. No pericardial effusion Mediastinum/Nodes: Midline trachea. 1.3 cm hypodense nodule left lobe of thyroid, no further workup based on life expectancy and comorbidity. No suspicious adenopathy. Multiple calcified hilar and mediastinal lymph nodes consistent with prior granulomatous disease. Lungs/Pleura: Small bilateral pleural effusions. Consolidations in the right greater than left lower lobes with air bronchograms. Mild ground-glass densities and patchy consolidations within the posterior upper lobes with mild central lobular density. Debris and or fluid within the right lower lobe bronchus. No pneumothorax Musculoskeletal: Sternum is intact. Kyphoscoliosis of the spine. Acute appearing fracture involves the superior endplate of K09 and involves the right anterior aspect of the vertebral body. CT ABDOMEN PELVIS FINDINGS Hepatobiliary: Calcified granuloma. Status post cholecystectomy. No biliary dilatation Pancreas: Unremarkable. No pancreatic ductal dilatation or surrounding inflammatory changes. Spleen: Numerous calcified granuloma Adrenals/Urinary Tract: Adrenal glands are normal. Kidneys show no hydronephrosis. Multiple left renal cysts. Bladder decompressed by Foley catheter. Stomach/Bowel: Stomach decompressed. No convincing small bowel distension. No acute bowel wall thickening. Moderate diffuse gaseous dilatation of the colon with fluid and air distension of the rectosigmoid colon. Question small lipoma within right abdominal small bowel, series 2, image 73. Vascular/Lymphatic: Moderate aortic atherosclerosis. No aneurysm. No suspicious nodes. Reproductive: Uterus and bilateral adnexa are unremarkable. Other: No free air.  Small amount fluid within the pelvis. Musculoskeletal: Intramedullary rodding of both femurs. Generalized soft tissue edema involving the left hip and thigh consistent with recent osseous trauma and surgery. IMPRESSION: 1. Small bilateral pleural effusions. Right greater than  left lower lobe consolidations  with partial consolidations and ground-glass density in the posterior upper lobes. Small amount of fluid and or debris in right lower lobe bronchus. Findings suspicious for pneumonia and or aspiration. 2. Diffuse air distension of the colon with moderate to marked air in fluid distension of rectosigmoid colon suggestive of ileus. No convincing evidence for an obstruction. There is no acute bowel wall thickening. 3. Acute appearing superior endplate fracture at W23 with extension of fracture lucency to the right anterior aspect of the vertebral body. Recent fracture of the left femur with postsurgical change and associated soft tissue edema Electronically Signed   By: Donavan Foil M.D.   On: 04/20/2021 21:20   ASSESSMENT AND PLAN:  Cynthia Hart is a 87 y.o. female with medical history significant for dementia, hypertension, diabetes mellitus who was brought into the ER for evaluation of left hip pain after an unwitnessed fall.  Left leg is noted to be shortened and externally rotated. Patient is lying in bed and appears comfortable and is unable to give any history at this time due to her underlying dementia.  Postoperative hypotension suspect due to volume loss -- operative notes reports proximately 300 mL blood loss -- hemoglobin 11-- 7.1-- she received 2 unit prbc with follow up hgb 8.3 -- patient was on IV phenylephrine-- currently off. Blood pressure remains stable.  Displaced left intertrochanteric femur fracture status post unwitnessed mechanical fall -- postop day #4 reduction and internal fixation of left hip fracture -- PRN pain meds   Dementia without behavioral disturbance --chronically on Aricept  Acute on Chronic respiratory failure with hypoxia and hypercapnia -- still requiring from 15L oxygen on NRB -- baseline requirement of 1.5L at home -- continue oxygen maintain sats greater than 92% -- PRN bronchodilators -- incentive  spirometer --chest xray unrevealing --VQ scan negative for PE --ABG shows respiratory acidosis --Bipap was attempted on 5/26 with only minimal improvement in PCO2 -patient unable to tolerate further bipap --CT chest shows possible aspiration/pneumonia with small pleural effusions  RLQ abd pain --possibly related to constipation --no significant improvement with laxatives --CT abdomen shows ileus without signs of obstruction  Goals of care -Long discussion with patient's daughter [name] who is her POA --[name] admits that patient had been having a gradual decline even prior to fall -she was able to ambulate with a walker, but had chronic pain from her prior hip repair and from scoliosis --Patient's current state was reviewed and with her overall frail state, multi organ system dysfunction, her long term prognosis is poor --[name] feels confident that her mother would choose dignity and comfort at this stage of her life --She is agreeable to transition care towards quality of life and comfort --will simplify her medication regimen to include medications directly impacting her comfort -since she has required frequent doses of narcotics for shortness of breath, we will start her on a dilaudid infusion which can be titrated for her comfort -expected prognosis is hours to days   Nutrition Status: Nutrition Problem: Severe Malnutrition Etiology: social / environmental circumstances (dementia, advanced) Signs/Symptoms: severe fat depletion,severe muscle depletion   follow dietitian recommendation    Procedures: left hip surgery Family communication : updated daughters at the bedside  Consults :ortho dr Roland Rack CODE STATUS: DNR, comfort measures DVT Prophylaxis :none Level of care: Med-Surg Status is: Inpatient  Remains inpatient appropriate because:Inpatient level of care appropriate due to severity of illness   Dispo: The patient is from: Home  Anticipated d/c is  to: SNF              Patient currently is not medically stable to d/c.   Difficult to place patient No        TOTAL TIME TAKING CARE OF THIS PATIENT: 35 minutes.  >50% time spent on counselling and coordination of care  Note: This dictation was prepared with Dragon dictation along with smaller phrase technology. Any transcriptional errors that result from this process are unintentional.  Kathie Dike M.D    [facility]   CC: Primary care physician; Healthcare, UncPatient ID: [name], female   DOB: [birth year], 87 y.o.   MRN: [id number]

## 2021-04-26 NOTE — Progress Notes (Signed)
Dilaudid drip started with Estill Bamberg RN charge nurse for 2nd verification of dilaudid drip.

## 2021-04-26 NOTE — Death Summary Note (Signed)
DEATH SUMMARY   Patient Details  Name: Cynthia Hart MRN: [id number] DOB: 1927/11/11  Admission/Discharge Information   Admit Date:  May 01, 2021  Date of Death:  05-06-21  Time of Death:  17:54  Length of Stay: 5  Referring Physician: [facility]   Reason(s) for Hospitalization  Left hip fracture  Diagnoses  Preliminary cause of death: Aspiration pneumonia Secondary Diagnoses (including complications and co-morbidities):  Principal Problem:   Femur fracture, left ([place]) Active Problems:   Pulmonary hypertension, unspecified ([place])   Ileus ([place])   Hypertension   Dementia without behavioral disturbance ([place])   Chronic respiratory failure ([place])   Protein-calorie malnutrition, severe   DNR (do not resuscitate)   Aspiration pneumonia of both lower lobes due to gastric secretions ([place])   Acute blood loss anemia   Acute metabolic encephalopathy   Acute on chronic respiratory failure with hypoxia and hypercapnia ([place])   Thrombocytopenia ([place])   Brief Hospital Course (including significant findings, care, treatment, and services provided and events leading to death)  Cynthia Hart is a 87 y.o. year old female with a history of dementia, hypertension, admitted to the hospital after an unwitnessed fall.  She was found to have a left hip fracture.  Patient was admitted to the hospital for operative management.  Postoperative course was complicated by development of postop ileus that was treated with laxatives.  She also had acute blood loss anemia after surgery and was transfused 2 units of PRBC.  She also developed acute on chronic respiratory failure secondary to aspiration pneumonia.  Patient was noted to be hypoxic and hypercapnic.  She did not tolerate wearing a BiPAP.  She consistently required nonrebreather mask for low oxygen saturations.  She became acutely confused due to hypoxia and hypercapnia.  After extensive conversations with the family, with her lack of  significant improvement, decision was made to transition towards comfort care.  Patient's daughter made it clear that she would not want to continue her current state.  The patient received IV narcotics for respiratory distress and pain management.  She subsequently passed away in the hospital.  Her family was at the bedside.      Pertinent Labs and Studies  Significant Diagnostic Studies DG Chest 1 View  Result Date: 05/01/2021 CLINICAL DATA:  Left hip fracture, initial encounter EXAM: CHEST  1 VIEW COMPARISON:  05/22/2019 FINDINGS: Cardiac shadow is enlarged but stable. Aortic calcifications are again seen. Lungs are well aerated bilaterally. No focal infiltrate or effusion is seen. No bony abnormality is seen. Stable scoliosis concave to the left is noted. IMPRESSION: No acute abnormality noted. Electronically Signed   By: Inez Catalina M.D.   On: 2021/05/01 12:21   CT Head Wo Contrast  Result Date: 05/01/21 CLINICAL DATA:  Unwitnessed fall.  Dementia. EXAM: CT HEAD WITHOUT CONTRAST TECHNIQUE: Contiguous axial images were obtained from the base of the skull through the vertex without intravenous contrast. COMPARISON:  04/15/2021 FINDINGS: Brain: Generalized age related atrophy. Mild chronic small-vessel ischemic changes of the deep white matter. 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 No sign of acute infarction, mass lesion, hemorrhage, hydrocephalus or extra-axial collection. Vascular: There is atherosclerotic calcification of the major vessels at the base of the brain. Skull: No skull fracture. Sinuses/Orbits: Clear/normal Other: None IMPRESSION: No acute or traumatic finding. Generalized atrophy. Chronic small-vessel change of the white matter. 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 Electronically Signed   By: Nelson Chimes M.D.   On: 05/01/2021 11:52   CT Head  Wo Contrast  Result Date: 04/15/2021 CLINICAL DATA:  Pain after fall. EXAM: CT HEAD WITHOUT CONTRAST CT CERVICAL SPINE  WITHOUT CONTRAST TECHNIQUE: Multidetector CT imaging of the head and cervical spine was performed following the standard protocol without intravenous contrast. Multiplanar CT image reconstructions of the cervical spine were also generated. COMPARISON:  CT of the brain March 25, 2020 FINDINGS: CT HEAD FINDINGS Brain: Ventricles and sulci are mildly prominent. No subdural, epidural, or subarachnoid hemorrhage. Cerebellum, brainstem, and basal cisterns are normal. No acute cortical ischemia or infarct. No mass effect or midline shift. Vascular: No hyperdense vessel or unexpected calcification. Skull: Normal. Negative for fracture or focal lesion. Sinuses/Orbits: No acute finding. Other: None. CT CERVICAL SPINE FINDINGS Alignment: There is scoliotic curvature of the cervical and upper thoracic spine. There is 3.5 mm anterolisthesis of C7 versus T1. No other malalignment. Skull base and vertebrae: No acute fracture. No primary bone lesion or focal pathologic process. Soft tissues and spinal canal: No prevertebral fluid or swelling. No visible canal hematoma. Disc levels: Facet degenerative changes. Degenerative disc disease most prominent at C5-6 with small osteophytes. Upper chest: Negative. Other: No other abnormalities. IMPRESSION: 1. No acute intracranial abnormalities. 2. 3.5 mm anterolisthesis of C7 versus T1 without associated soft tissue swelling/thickening or fracture. The finding is likely due to facet degenerative changes at this level. No acute fracture or other significant malalignment identified. Electronically Signed   By: Dorise Bullion III M.D   On: 04/15/2021 11:23   CT Cervical Spine Wo Contrast  Result Date: 04/15/2021 CLINICAL DATA:  Pain after fall. EXAM: CT HEAD WITHOUT CONTRAST CT CERVICAL SPINE WITHOUT CONTRAST TECHNIQUE: Multidetector CT imaging of the head and cervical spine was performed following the standard protocol without intravenous contrast. Multiplanar CT image reconstructions of  the cervical spine were also generated. COMPARISON:  CT of the brain March 25, 2020 FINDINGS: CT HEAD FINDINGS Brain: Ventricles and sulci are mildly prominent. No subdural, epidural, or subarachnoid hemorrhage. Cerebellum, brainstem, and basal cisterns are normal. No acute cortical ischemia or infarct. No mass effect or midline shift. Vascular: No hyperdense vessel or unexpected calcification. Skull: Normal. Negative for fracture or focal lesion. Sinuses/Orbits: No acute finding. Other: None. CT CERVICAL SPINE FINDINGS Alignment: There is scoliotic curvature of the cervical and upper thoracic spine. There is 3.5 mm anterolisthesis of C7 versus T1. No other malalignment. Skull base and vertebrae: No acute fracture. No primary bone lesion or focal pathologic process. Soft tissues and spinal canal: No prevertebral fluid or swelling. No visible canal hematoma. Disc levels: Facet degenerative changes. Degenerative disc disease most prominent at C5-6 with small osteophytes. Upper chest: Negative. Other: No other abnormalities. IMPRESSION: 1. No acute intracranial abnormalities. 2. 3.5 mm anterolisthesis of C7 versus T1 without associated soft tissue swelling/thickening or fracture. The finding is likely due to facet degenerative changes at this level. No acute fracture or other significant malalignment identified. Electronically Signed   By: Dorise Bullion III M.D   On: 04/15/2021 11:23   NM Pulmonary Perfusion  Result Date: 04/19/2021 CLINICAL DATA:  High clinical suspicion of pulmonary embolism, unwitnessed fall with LEFT hip pain, LEFT hip fracture with LEFT hip surgery on 04/05/2021. Poor historian due to dementia. Respiratory failure, hypoxemia, question pulmonary embolism EXAM: NUCLEAR MEDICINE PERFUSION LUNG SCAN TECHNIQUE: Perfusion images were obtained in multiple projections after intravenous injection of radiopharmaceutical. Ventilation scans intentionally deferred if perfusion scan and chest x-ray  adequate for interpretation during COVID 19 epidemic. RADIOPHARMACEUTICALS:  4.02 mCi Tc-84m MAA  IV COMPARISON:  Chest radiograph 04/18/2021 FINDINGS: Thoracic deformity due to pronounced dextroconvex thoracic scoliosis. Artifacts from patient's arms and minimal RIGHT basilar atelectasis. Mild patchy irregular tracer distribution in the periphery of both lungs, pattern favoring parenchymal lung disease. No triangular segmental or subsegmental perfusion defects are seen to suggest pulmonary embolism. IMPRESSION: No scintigraphic evidence of pulmonary embolism. Electronically Signed   By: Lavonia Dana M.D.   On: 04/19/2021 14:48   DG Chest Port 1 View  Result Date: 04/18/2021 CLINICAL DATA:  Hypoxia. EXAM: PORTABLE CHEST 1 VIEW COMPARISON:  Single-view of the chest 04/05/2021 and 05/22/2019. FINDINGS: Heart size is upper normal. Subsegmental atelectasis is seen in the right lung base. Lungs otherwise clear. No pneumothorax or pleural fluid. Aortic atherosclerosis. Severe convex right thoracic scoliosis noted. IMPRESSION: No acute disease. Aortic Atherosclerosis (ICD10-I70.0). Electronically Signed   By: Inge Rise M.D.   On: 04/18/2021 14:49   DG HIP OPERATIVE UNILAT WITH PELVIS LEFT  Result Date: 04/01/2021 CLINICAL DATA:  Elective surgery. EXAM: OPERATIVE LEFT HIP (WITH PELVIS IF PERFORMED) TECHNIQUE: Fluoroscopic spot image(s) were submitted for interpretation post-operatively. COMPARISON:  Radiograph earlier today. FINDINGS: Six fluoroscopic spot views of the left hip and femur obtained in the operating room. Intramedullary nail with trans trochanteric and distal locking screw fixation of intertrochanteric femur fracture. Total fluoroscopy time 30 seconds. Total dose not provided. IMPRESSION: Procedural fluoroscopy for ORIF intertrochanteric femur fracture. Electronically Signed   By: Keith Rake M.D.   On: 04/21/2021 19:21   DG Hip Unilat W or Wo Pelvis 2-3 Views Left  Result Date:  04/18/2021 CLINICAL DATA:  Left hip pain following fall, initial encounter EXAM: DG HIP (WITH OR WITHOUT PELVIS) 2-3V LEFT COMPARISON:  04/15/2021 FINDINGS: Pelvic ring is intact. Postsurgical changes in the right femur are noted. New intratrochanteric fracture in the proximal left femur is noted with impaction and angulation at the fracture site. IMPRESSION: New left intratrochanteric femoral fracture. Electronically Signed   By: Inez Catalina M.D.   On: 04/07/2021 12:15   DG Hip Unilat W or Wo Pelvis 2-3 Views Left  Result Date: 04/15/2021 CLINICAL DATA:  87 year old female status post fall.  Found down. EXAM: DG HIP (WITH OR WITHOUT PELVIS) 2-3V LEFT COMPARISON:  Left femur series 03/25/2020. FINDINGS: Previous proximal right femur ORIF. Femoral heads normally located. No pelvis fracture identified. Proximal left femur appears stable and intact. Similar generalized bowel gas in the lower abdomen and pelvis. IMPRESSION: No acute fracture or dislocation identified about the left hip or pelvis. Electronically Signed   By: Genevie Ann M.D.   On: 04/15/2021 11:02   CT CHEST ABDOMEN PELVIS WO CONTRAST  Result Date: 04/20/2021 CLINICAL DATA:  Abdomen distension somnolent EXAM: CT CHEST, ABDOMEN AND PELVIS WITHOUT CONTRAST TECHNIQUE: Multidetector CT imaging of the chest, abdomen and pelvis was performed following the standard protocol without IV contrast. COMPARISON:  Chest x-ray 04/14/2021, CT 05/30/2019, radiograph 04/07/2021 FINDINGS: CT CHEST FINDINGS Cardiovascular: Limited evaluation without intravenous contrast. Moderate aortic atherosclerosis. No aneurysmal dilatation. Mild coronary calcification. Borderline cardiac size. No pericardial effusion Mediastinum/Nodes: Midline trachea. 1.3 cm hypodense nodule left lobe of thyroid, no further workup based on life expectancy and comorbidity. No suspicious adenopathy. Multiple calcified hilar and mediastinal lymph nodes consistent with prior granulomatous disease.  Lungs/Pleura: Small bilateral pleural effusions. Consolidations in the right greater than left lower lobes with air bronchograms. Mild ground-glass densities and patchy consolidations within the posterior upper lobes with mild central lobular density. Debris and or fluid within the  right lower lobe bronchus. No pneumothorax Musculoskeletal: Sternum is intact. Kyphoscoliosis of the spine. Acute appearing fracture involves the superior endplate of N47 and involves the right anterior aspect of the vertebral body. CT ABDOMEN PELVIS FINDINGS Hepatobiliary: Calcified granuloma. Status post cholecystectomy. No biliary dilatation Pancreas: Unremarkable. No pancreatic ductal dilatation or surrounding inflammatory changes. Spleen: Numerous calcified granuloma Adrenals/Urinary Tract: Adrenal glands are normal. Kidneys show no hydronephrosis. Multiple left renal cysts. Bladder decompressed by Foley catheter. Stomach/Bowel: Stomach decompressed. No convincing small bowel distension. No acute bowel wall thickening. Moderate diffuse gaseous dilatation of the colon with fluid and air distension of the rectosigmoid colon. Question small lipoma within right abdominal small bowel, series 2, image 73. Vascular/Lymphatic: Moderate aortic atherosclerosis. No aneurysm. No suspicious nodes. Reproductive: Uterus and bilateral adnexa are unremarkable. Other: No free air.  Small amount fluid within the pelvis. Musculoskeletal: Intramedullary rodding of both femurs. Generalized soft tissue edema involving the left hip and thigh consistent with recent osseous trauma and surgery. IMPRESSION: 1. Small bilateral pleural effusions. Right greater than left lower lobe consolidations with partial consolidations and ground-glass density in the posterior upper lobes. Small amount of fluid and or debris in right lower lobe bronchus. Findings suspicious for pneumonia and or aspiration. 2. Diffuse air distension of the colon with moderate to marked air in  fluid distension of rectosigmoid colon suggestive of ileus. No convincing evidence for an obstruction. There is no acute bowel wall thickening. 3. Acute appearing superior endplate fracture at S96 with extension of fracture lucency to the right anterior aspect of the vertebral body. Recent fracture of the left femur with postsurgical change and associated soft tissue edema Electronically Signed   By: Donavan Foil M.D.   On: 04/20/2021 21:20    Microbiology Recent Results (from the past 240 hour(s))  Resp Panel by RT-PCR (Flu A&B, Covid) Nasopharyngeal Swab     Status: None   Collection Time: 04/01/2021 12:31 PM   Specimen: Nasopharyngeal Swab; Nasopharyngeal(NP) swabs in vial transport medium  Result Value Ref Range Status   SARS Coronavirus 2 by RT PCR NEGATIVE NEGATIVE Final    Comment: (NOTE) SARS-CoV-2 target nucleic acids are NOT DETECTED.  The SARS-CoV-2 RNA is generally detectable in upper respiratory specimens during the acute phase of infection. The lowest concentration of SARS-CoV-2 viral copies this assay can detect is 138 copies/mL. A negative result does not preclude SARS-Cov-2 infection and should not be used as the sole basis for treatment or other patient management decisions. A negative result may occur with  improper specimen collection/handling, submission of specimen other than nasopharyngeal swab, presence of viral mutation(s) within the areas targeted by this assay, and inadequate number of viral copies(<138 copies/mL). A negative result must be combined with clinical observations, patient history, and epidemiological information. The expected result is Negative.  Fact Sheet for Patients:  EntrepreneurPulse.com.au  Fact Sheet for Healthcare Providers:  IncredibleEmployment.be  This test is no t yet approved or cleared by the [place] FDA and  has been authorized for detection and/or diagnosis of SARS-CoV-2 by FDA under an  Emergency Use Authorization (EUA). This EUA will remain  in effect (meaning this test can be used) for the duration of the COVID-19 declaration under Section 564(b)(1) of the Act, 21 U.S.C.section 360bbb-3(b)(1), unless the authorization is terminated  or revoked sooner.       Influenza A by PCR NEGATIVE NEGATIVE Final   Influenza B by PCR NEGATIVE NEGATIVE Final    Comment: (NOTE) The Xpert Xpress SARS-CoV-2/FLU/RSV  plus assay is intended as an aid in the diagnosis of influenza from Nasopharyngeal swab specimens and should not be used as a sole basis for treatment. Nasal washings and aspirates are unacceptable for Xpert Xpress SARS-CoV-2/FLU/RSV testing.  Fact Sheet for Patients: EntrepreneurPulse.com.au  Fact Sheet for Healthcare Providers: IncredibleEmployment.be  This test is not yet approved or cleared by the [place] FDA and has been authorized for detection and/or diagnosis of SARS-CoV-2 by FDA under an Emergency Use Authorization (EUA). This EUA will remain in effect (meaning this test can be used) for the duration of the COVID-19 declaration under Section 564(b)(1) of the Act, 21 U.S.C. section 360bbb-3(b)(1), unless the authorization is terminated or revoked.  Performed at [facility], [address] [place], [place] [address]   MRSA PCR Screening     Status: None   Collection Time: 04/02/2021  9:25 PM   Specimen: Nasopharyngeal  Result Value Ref Range Status   MRSA by PCR NEGATIVE NEGATIVE Final    Comment:        The GeneXpert MRSA Assay (FDA approved for NASAL specimens only), is one component of a comprehensive MRSA colonization surveillance program. It is not intended to diagnose MRSA infection nor to guide or monitor treatment for MRSA infections. Performed at [facility], [address] [place], [place] [address]     Lab Basic Metabolic Panel: Recent Labs  Lab 04/13/2021 1054  04/18/21 0338 04/19/21 0915 04/20/21 0450 04/21/21 0523  NA 136 138 136 139 142  K 3.8 3.6 4.1 4.9 5.1  CL 100 106 103 104 103  CO2 29 24 28 31  33*  GLUCOSE 139* 135* 122* 146* 156*  BUN 15 18 21 22  30*  CREATININE 0.50 0.47 0.46 0.37* 0.50  CALCIUM 8.3* 6.9* 7.2* 7.6* 8.6*  PHOS  --   --   --   --  1.9*   Liver Function Tests: Recent Labs  Lab 04/21/21 0523  ALBUMIN 3.0*   No results for input(s): LIPASE, AMYLASE in the last 168 hours. No results for input(s): AMMONIA in the last 168 hours. CBC: Recent Labs  Lab 04/01/2021 1054 04/18/21 0338 04/18/21 2110 04/19/21 0915 04/20/21 0450 04/21/21 0523  WBC 13.1* 12.6*  --  9.6 9.9 10.3  NEUTROABS 11.3*  --   --   --   --   --   HGB 11.5* 7.1* 7.3* 8.3* 8.2* 8.0*  HCT 34.4* 22.6* 22.1* 24.8* 25.6* 25.8*  MCV 101.8* 104.6*  --  96.5 97.7 103.6*  PLT 169 134*  --  102* 123* 166   Cardiac Enzymes: No results for input(s): CKTOTAL, CKMB, CKMBINDEX, TROPONINI in the last 168 hours. Sepsis Labs: Recent Labs  Lab 04/18/21 0338 04/19/21 0915 04/20/21 0450 04/21/21 0523  WBC 12.6* 9.6 9.9 10.3    Procedures/Operations  INTRAMEDULLARY (IM) NAIL INTERTROCHANTRIC (Left)   Cynthia Hart Apr 24, 2021, 7:49 PM

## 2021-04-26 NOTE — Death Summary Note (Signed)
Rn Walked into room pt took 2 more breaths and stopped breathing. After listening for heart sounds, there were no more. Dr. Roderic Palau was notified and Domenica Reamer RN was 2nd verification for absent heart sounds and breath sounds and no pulse. Time of death 17:54

## 2021-04-26 NOTE — Progress Notes (Signed)
  Subjective: 5 Days Post-Op Procedure(s) (LRB): INTRAMEDULLARY (IM) NAIL INTERTROCHANTRIC (Left) Patient seen with  Dr Rudene Christians. Patient asleep upn entering room, on NRB mask.   Objective: Vital signs in last 24 hours: Temp:  [98.4 F (36.9 C)-99 F (37.2 C)] 98.5 F (36.9 C) (05/28 0741) Pulse Rate:  [63-104] 90 (05/28 0741) Resp:  [16-43] 43 (05/28 0920) BP: (100-135)/(68-82) 111/73 (05/28 0741) SpO2:  [93 %-97 %] 97 % (05/28 0741)  Intake/Output from previous day:  Intake/Output Summary (Last 24 hours) at 05-01-21 1202 Last data filed at May 01, 2021 1030 Gross per 24 hour  Intake 0 ml  Output 600 ml  Net -600 ml    Intake/Output this shift: Total I/O In: 0  Out: 150 [Urine:150]  Labs: Recent Labs    04/20/21 0450 04/21/21 0523  HGB 8.2* 8.0*   Recent Labs    04/20/21 0450 04/21/21 0523  WBC 9.9 10.3  RBC 2.62* 2.49*  HCT 25.6* 25.8*  PLT 123* 166   Recent Labs    04/20/21 0450 04/21/21 0523  NA 139 142  K 4.9 5.1  CL 104 103  CO2 31 33*  BUN 22 30*  CREATININE 0.37* 0.50  GLUCOSE 146* 156*  CALCIUM 7.6* 8.6*   No results for input(s): LABPT, INR in the last 72 hours.   EXAM General - Patient is sleeping Extremity - Compartment soft Dressing/Incision -mild dried blood noted on all 3 incisions Motor Function - unable to assess motor function    Assessment/Plan: 5 Days Post-Op Procedure(s) (LRB): INTRAMEDULLARY (IM) NAIL INTERTROCHANTRIC (Left) Principal Problem:   Femur fracture, left ([facility]) Active Problems:   Hypertension   Dementia without behavioral disturbance ([facility])   Chronic respiratory failure ([facility])   Protein-calorie malnutrition, severe  Estimated body mass index is 18.44 kg/m as calculated from the following:   Height as of this encounter: 5\' 8"  (1.727 m).   Weight as of this encounter: 55 kg.  Hold PT at this time.   Labs reviewed. Hg at 8.0 currently.      DVT Prophylaxis - Ted hose Weight-Bearing as tolerated to  left leg  Cassell Smiles, PA-C [facility] 2021/05/01, 12:02 PM

## 2021-04-26 NOTE — Progress Notes (Signed)
PT Cancellation Note  Patient Details Name: Cynthia Hart MRN: [id number] DOB: [birth year]   Cancelled Treatment:     Hold PT services at this time due to pt transitioning to [place].   Josie Dixon 17-May-2021, 11:57 AM

## 2021-04-26 NOTE — Progress Notes (Signed)
Palliative called and no one is covering this hospital this weekend; Dr. Roderic Palau notified.

## 2021-04-26 DEATH — deceased
